# Patient Record
Sex: Female | Born: 1970 | Race: White | Hispanic: No | Marital: Married | Smoking: Never smoker
Health system: Southern US, Community
[De-identification: ages and names within clinical notes are randomized; demographics above are authoritative.]

## PROBLEM LIST (undated history)

## (undated) DIAGNOSIS — E039 Hypothyroidism, unspecified: Secondary | ICD-10-CM

## (undated) DIAGNOSIS — F419 Anxiety disorder, unspecified: Secondary | ICD-10-CM

## (undated) DIAGNOSIS — G4700 Insomnia, unspecified: Secondary | ICD-10-CM

---

## 1997-08-06 ENCOUNTER — Ambulatory Visit (HOSPITAL_COMMUNITY): Admission: RE | Admit: 1997-08-06 | Discharge: 1997-08-06 | Payer: Self-pay | Admitting: Obstetrics and Gynecology

## 1997-11-12 ENCOUNTER — Other Ambulatory Visit: Admission: RE | Admit: 1997-11-12 | Discharge: 1997-11-12 | Payer: Self-pay | Admitting: Obstetrics and Gynecology

## 1998-02-18 ENCOUNTER — Other Ambulatory Visit: Admission: RE | Admit: 1998-02-18 | Discharge: 1998-02-18 | Payer: Self-pay | Admitting: Obstetrics and Gynecology

## 1998-08-28 ENCOUNTER — Emergency Department (HOSPITAL_COMMUNITY): Admission: EM | Admit: 1998-08-28 | Discharge: 1998-08-28 | Payer: Self-pay

## 1998-09-23 ENCOUNTER — Other Ambulatory Visit: Admission: RE | Admit: 1998-09-23 | Discharge: 1998-09-23 | Payer: Self-pay | Admitting: Obstetrics and Gynecology

## 1999-01-06 ENCOUNTER — Other Ambulatory Visit: Admission: RE | Admit: 1999-01-06 | Discharge: 1999-01-06 | Payer: Self-pay | Admitting: Obstetrics and Gynecology

## 2000-04-09 ENCOUNTER — Other Ambulatory Visit: Admission: RE | Admit: 2000-04-09 | Discharge: 2000-04-09 | Payer: Self-pay | Admitting: Obstetrics and Gynecology

## 2000-11-10 ENCOUNTER — Encounter (INDEPENDENT_AMBULATORY_CARE_PROVIDER_SITE_OTHER): Payer: Self-pay

## 2000-11-10 ENCOUNTER — Ambulatory Visit (HOSPITAL_COMMUNITY): Admission: RE | Admit: 2000-11-10 | Discharge: 2000-11-10 | Payer: Self-pay | Admitting: Obstetrics and Gynecology

## 2001-04-29 ENCOUNTER — Encounter: Payer: Self-pay | Admitting: Neurosurgery

## 2001-05-03 ENCOUNTER — Encounter: Payer: Self-pay | Admitting: Neurosurgery

## 2001-05-03 ENCOUNTER — Inpatient Hospital Stay (HOSPITAL_COMMUNITY): Admission: RE | Admit: 2001-05-03 | Discharge: 2001-05-04 | Payer: Self-pay | Admitting: Neurosurgery

## 2002-01-11 ENCOUNTER — Other Ambulatory Visit: Admission: RE | Admit: 2002-01-11 | Discharge: 2002-01-11 | Payer: Self-pay | Admitting: Obstetrics and Gynecology

## 2002-09-07 ENCOUNTER — Other Ambulatory Visit: Admission: RE | Admit: 2002-09-07 | Discharge: 2002-09-07 | Payer: Self-pay | Admitting: Obstetrics and Gynecology

## 2003-01-29 ENCOUNTER — Inpatient Hospital Stay (HOSPITAL_COMMUNITY): Admission: AD | Admit: 2003-01-29 | Discharge: 2003-01-29 | Payer: Self-pay | Admitting: Obstetrics and Gynecology

## 2003-03-10 ENCOUNTER — Inpatient Hospital Stay (HOSPITAL_COMMUNITY): Admission: AD | Admit: 2003-03-10 | Discharge: 2003-03-10 | Payer: Self-pay | Admitting: Obstetrics and Gynecology

## 2003-03-19 ENCOUNTER — Inpatient Hospital Stay (HOSPITAL_COMMUNITY): Admission: AD | Admit: 2003-03-19 | Discharge: 2003-03-21 | Payer: Self-pay | Admitting: Obstetrics and Gynecology

## 2003-05-02 ENCOUNTER — Other Ambulatory Visit: Admission: RE | Admit: 2003-05-02 | Discharge: 2003-05-02 | Payer: Self-pay | Admitting: Obstetrics and Gynecology

## 2004-03-08 ENCOUNTER — Emergency Department (HOSPITAL_COMMUNITY): Admission: EM | Admit: 2004-03-08 | Discharge: 2004-03-08 | Payer: Self-pay | Admitting: Emergency Medicine

## 2005-02-27 ENCOUNTER — Encounter: Admission: RE | Admit: 2005-02-27 | Discharge: 2005-02-27 | Payer: Self-pay | Admitting: Obstetrics and Gynecology

## 2005-05-05 ENCOUNTER — Ambulatory Visit: Payer: Self-pay | Admitting: Family Medicine

## 2005-07-29 ENCOUNTER — Ambulatory Visit: Payer: Self-pay | Admitting: Family Medicine

## 2005-08-10 ENCOUNTER — Emergency Department (HOSPITAL_COMMUNITY): Admission: EM | Admit: 2005-08-10 | Discharge: 2005-08-10 | Payer: Self-pay | Admitting: Emergency Medicine

## 2005-08-11 ENCOUNTER — Ambulatory Visit (HOSPITAL_COMMUNITY): Admission: RE | Admit: 2005-08-11 | Discharge: 2005-08-11 | Payer: Self-pay | Admitting: Emergency Medicine

## 2005-10-19 ENCOUNTER — Ambulatory Visit: Payer: Self-pay | Admitting: Family Medicine

## 2006-07-20 ENCOUNTER — Encounter (INDEPENDENT_AMBULATORY_CARE_PROVIDER_SITE_OTHER): Payer: Self-pay | Admitting: *Deleted

## 2006-07-20 ENCOUNTER — Ambulatory Visit (HOSPITAL_BASED_OUTPATIENT_CLINIC_OR_DEPARTMENT_OTHER): Admission: RE | Admit: 2006-07-20 | Discharge: 2006-07-20 | Payer: Self-pay | Admitting: Orthopaedic Surgery

## 2010-02-14 ENCOUNTER — Ambulatory Visit (HOSPITAL_COMMUNITY)
Admission: RE | Admit: 2010-02-14 | Discharge: 2010-02-14 | Payer: 59 | Attending: Obstetrics and Gynecology | Admitting: Obstetrics and Gynecology

## 2010-08-08 NOTE — H&P (Signed)
Kathryn Marshall, Kathryn Marshall                       ACCOUNT NO.:  [id number]   MEDICAL RECORD NO.:  [id number]                   PATIENT TYPE:   LOCATION:                                       FACILITY:   PHYSICIAN:  Lenoard Aden, M.D.             DATE OF BIRTH:  January 30, 1971   DATE OF ADMISSION:  03/19/2003  DATE OF DISCHARGE:                                HISTORY & PHYSICAL   CHIEF COMPLAINT:  Oligohydramnios.   HISTORY OF PRESENT ILLNESS:  The patient is a 40 year old white female G2 P1  EDD of April 01, 2003 who presents at 38 weeks with oligohydramnios.   PAST MEDICAL HISTORY:  1. Vaginal delivery in 1999.  2. LEEP in 1999.  3. Excision of vulvar scar tissue in 1999.  4. Foot surgery,  5. Wrist fracture.  6. Labioplasty in 2002.   FAMILY HISTORY:  Heart disease and breast cancer.   SOCIAL HISTORY:  The patient is smoker.  She denies domestic or physical  violence.   PRENATAL LABORATORY DATA:  Reveals GBS to be negative and blood type to be O  positive, Rh antibody negative.  Rubella immune.  Hepatitis, toxoplasmosis,  and HIV are negative.   PHYSICAL EXAMINATION:  GENERAL:  She is a well-developed, well-nourished  white female in no acute distress.  HEENT:  Normal.  LUNGS:  Clear.  HEART:  Regular rhythm.  ABDOMEN:  Soft, gravid, and nontender.  Estimated fetal weight of 7.5  pounds.  PELVIC:  Cervix is 1-2 cm, scarred, vertex, and -1.  EXTREMITIES:  Reveal no cords.  NEUROLOGIC:  Nonfocal.   IMPRESSION:  1. Thirty-eight-week obstetrical patient.  2. Oligohydramnios at term.   PLAN:  Proceed with induction.  Risks, benefits discussed.  Start high-dose  Pitocin.                                              Lenoard Aden, M.D.   RJT/MEDQ  D:  03/19/2003  T:  03/19/2003  Job:  [id number]

## 2010-08-08 NOTE — Op Note (Signed)
NAMEKAILEIGH, VISWANATHAN             ACCOUNT NO.:  [id number]   MEDICAL RECORD NO.:  [id number]          PATIENT TYPE:  AMB   LOCATION:  DSC                          FACILITY:  [facility]   PHYSICIAN:  Lubertha Basque. Dalldorf, M.D.DATE OF BIRTH:  [birth year]   DATE OF PROCEDURE:  07/20/2006  DATE OF DISCHARGE:                               OPERATIVE REPORT   PREOPERATIVE DIAGNOSIS:  Left foot second interspace Morton's neuroma.   POSTOPERATIVE DIAGNOSIS:  Left foot second interspace Morton's neuroma.   PROCEDURE:  Excision Morton's neuroma second interspace left foot.   ANESTHESIA:  General.   ATTENDING SURGEON:  Lubertha Basque. Jerl Santos, M.D.   ASSISTANT:  Lindwood Qua, P.A.   INDICATIONS FOR PROCEDURE:  The patient is a 40 year old woman with a  long history of a painful left foot.  She has persisted with difficulty  despite wide shoes and interspace injection.  She has undergone an MRI  scan which shows a prominent Morton's neuroma in the second interspace  of her left foot.  She has pain which limits her ability to wear certain  shoes and she would like to be able to wake board this summer in a tight  fitting device.  She is offered removal of her neuroma.  Informed  operative consent was obtained after discussion of possible  complications of reaction to anesthesia, infection, recurrence.  The  patient also understands that she will be numb in the affected  interspace forever.   SUMMARY OF FINDINGS AND PROCEDURE:  Under general anesthesia through a  small dorsal incision a fairly large Morton's neuroma was removed from  the second interspace of the left foot.  This was sent to pathology.   DESCRIPTION OF PROCEDURE:  The patient was taken to the operative suite  where general anesthetic was applied without difficulty.  She was  positioned supine and prepped, draped in normal sterile fashion.  After  the administration of IV Kefzol the left leg was elevated,  exsanguinated, tourniquet  inflated about the calf.  A dorsal incision  was made with dissection down to the metatarsal heads.  A lamina  spreader was placed.  I then incised the ligament between the metatarsal  heads.  A prominent neuroma was easily visualized.  This was resected  from just proximal to the metatarsal head to just past the bifurcation.  This was sent in formalin to pathology.  The tourniquet was deflated and  meticulous hemostasis was achieved with pressure and cautery.  The wound  was irrigated followed by reapproximation of the skin with nylon.  Some  Marcaine was injected about the incision site.  Adaptic was applied  followed by dry gauze and loose Ace wrap.  Estimated blood loss was  minimal.  Intraoperative fluids can be obtained from anesthesia records.  Tourniquet time can also be obtained from anesthesia records.   DISPOSITION:  The patient was extubated in the operating room and taken  to recovery in stable addition.  She was to go home same-day, follow up  in the office less than a week.  I will contact her by phone tonight.  Lubertha Basque Jerl Santos, M.D.  Electronically Signed     PGD/MEDQ  D:  07/20/2006  T:  07/20/2006  Job:  [id number]

## 2010-08-08 NOTE — H&P (Signed)
[facility]  Patient:    Kathryn Marshall, KINSEL Visit Number: [id number] MRN: [id number]          Service Type: Attending:  Lenoard Aden, M.D. Adm. Date:  11/10/00   CC:         [facility]   History and Physical  HISTORY OF PRESENT ILLNESS:   The patient is a 40 year old white female gravida 1, para 1, with a history of uncomplicated vaginal delivery in 1999, history of LEEP in 1999, who presents with vulvodynia and enlarging perianal lesion.  PAST MEDICAL HISTORY:         Remarkable for vaginal delivery in 1999, LEEP in 1999, excision of vulvar scar tissue in 1999, foot surgery, and wrist fracture.  FAMILY HISTORY:               Heart disease and breast cancer.  SOCIAL HISTORY:               Nonsmoker and nondrinker.  Denies domestic or physical violence.  PHYSICAL EXAMINATION:  GENERAL:                      She is a well-developed and well-nourished white female in no apparent distress.  HEENT:                        Normal.  LUNGS:                        Clear.  HEART:                        Regular rhythm.  ABDOMEN:                      Soft, scaphoid, and nontender.  PELVIC EXAMINATION:           Reveals evidence of scar tissue on the left labia minora with separation of the previous anatomical landmarks in the labial area, and an enlarging right perianal lesion which is tender to palpation.  RECTAL EXAMINATION:           Deferred.  EXTREMITIES:                  There are no cords.  NEUROLOGIC EXAMINATION:       Nonfocal.  IMPRESSION:                   1. Vulvodynia with vulvar scar tissue.                               2. Enlarging perianal lesion.  PLAN:                         Proceed with labioplasty, excision of vulvar scar tissue, and removal of perianal lesion.  Risks of anesthesia, infection, bleeding, and inability to cure vulvar pain discussed.  The patient acknowledges and desires to proceed. Attending:   Lenoard Aden, M.D. DD:  11/10/00 TD:  11/10/00 Job: [id number]

## 2010-08-08 NOTE — H&P (Signed)
[place]  Patient:    Kathryn Marshall, Kathryn Marshall Visit Number: [id number] MRN: [id number]          Service Type: SUR Location: 3000 [id number] Attending Physician:  Emeterio Reeve Dictated by:   Payton Doughty, M.D. Admit Date:  05/03/2001 Discharge Date: 05/04/2001                           History and Physical  ADMITTING DIAGNOSIS:  Herniated disk, C4-5 and C5-6.  SERVICE:  Neurosurgery.  HISTORY OF PRESENT ILLNESS:  This is a very nice 40 year old right-handed white girl who had had neck pain intermittently.  A couple of weeks ago had neck pain that was radiating down her arm to where she could hardly move it. Visited a [place], did not help her much.  Saw Dr. Jerl Santos who obtained an MRI of her cervical spine that demonstrates spondylitic change and biforaminal narrowing, worse on the right than on the left at 4-5, and a fresh herniated disk centrally at 4-5 and she was referred to me.  MEDICAL HISTORY:  Remarkable for no significant diseases.  She has had cosmetic surgery, she had a foot operation, and cervical cancer in situ.  ALLERGIES:  None.  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  FAMILY HISTORY:  Mom is 6 in okay health.  Daddys history is not given.  REVIEW OF SYSTEMS:  Remarkable for arm weakness, back pain, arm pain, and neck pain.  PHYSICAL EXAMINATION:  HEENT:  Within normal limits.  NECK:  She has reasonable range of motion of her neck.  She carries it slightly inner flexed and is limited in turning toward the right and left by discomfort.  CHEST:  Clear.  CARDIAC:  Regular rate and rhythm.  ABDOMEN:  Nontender, no hepatosplenomegaly.  EXTREMITIES:  Without clubbing or cyanosis.  GENITOURINARY:  Deferred.  PERIPHERAL PULSES:  Good.  NEUROLOGIC:  She is awake, alert, oriented.  Her cranial nerves  are intact. Motor exam shows 5/5 strength throughout the left upper extremity.  On the right side she has 5-/5 weakness of the deltoid, 5-/5 in the biceps, and triceps appears to be intact.  Sensory deficit is described in a right C6 distribution.  Reflexes are 1 at the biceps, 2 at the left biceps, 1 at the triceps bilaterally, 2 at the brachioradialis bilaterally.  Lower extremities are nonmyelopathic.  LABORATORY DATA:  She comes accompanied with plain films from her chiropractors office that demonstrates spondylitic change and posterior protruding osteophytes at C4-5, as well as an MRI that shows a central disk at 5-6 and biforaminal narrowing secondary to the osteophytic injuries at 4-5, right worse than left.  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  She has a small central canal and because of the osteophytes there is cord compression.  PLAN:  Anterior cervical diskectomy and fusion of both C4-5 and C5-C6.  The risks and benefits of this approach have been discussed with her and she wishes to proceed. Dictated by:   Payton Doughty, M.D. Attending Physician:  Emeterio Reeve DD:  05/03/01 TD:  05/03/01 Job: [id number]

## 2010-08-08 NOTE — Op Note (Signed)
[facility]  Patient:    Kathryn Marshall, Kathryn Marshall Visit Number: [id number] MRN: [id number]          Service Type: DSU Location: [facility] Attending Physician:  Lenoard Aden Proc. Date: 11/10/00 Adm. Date:  11/10/2000   CC:         [facility]   Operative Report  PREOPERATIVE DIAGNOSES:       1. Vulvodynia with vulvar scar tissue.                               2. Enlarging perianal lesion.  POSTOPERATIVE DIAGNOSES:      1. Vulvodynia with vulvar scar tissue.                               2. Enlarging perianal lesion.  OPERATION:                    1. Labioplasty with excision of vulvar scar                                  tissue.                               2. Excision of perianal lesion.  SURGEON:                      Lenoard Aden, M.D.  ANESTHESIA:                   General.  ESTIMATED BLOOD LOSS:         Less than 50 cc.  COMPLICATIONS:                None.  COUNTS:                       Correct.  DISPOSITION:                  The patient went to recovery in good condition.  SPECIMEN:                     Perianal lesion.  DESCRIPTION OF PROCEDURE:     After being apprised of the risks of anesthesia, infection, bleeding, injury to abdominal organs and need for repair, inability to cure vulvar pain, the patient was brought to the operating room where she was administered general anesthesia without complications, prepped and draped in the usual sterile fashion.  At this time, the left labia minora is grasped at the two areas consistent with scar tissue.  The scar tissue is excised using a #15 blade scalpel and sharp dissection with fine iris type scissors. At this time, the labia minora is reestablished using a 4-0 Monocryl in interrupted fashion.  After excision of the scar tissue, the suture defect is reapproximated using fine interrupted sutures with hemostasis achieved.  The perianal lesion is then infiltrated using a dilute  Marcaine and epinephrine solution and excised using iris scissors.  The defect is closed using a 4-0 Monocryl suture.  Good hemostasis is noted.  Of note is that electrocautery is used to achieve hemostasis at the base of the labioplasty defect.  At this time, bladder is catheterized until  empty.  Clear urine is noted.  The patient tolerated the procedure well, was awakened, and transferred to the recovery room in good condition. Attending Physician:  Lenoard Aden DD:  11/10/00 TD:  11/11/00 Job: [id number]

## 2010-08-08 NOTE — Op Note (Signed)
[place]  Patient:    Kathryn Marshall, Kathryn Marshall Visit Number: [id number] MRN: [id number]          Service Type: SUR Location: 3000 [id number] Attending Physician:  Emeterio Reeve Dictated by:   Payton Doughty, M.D. Proc. Date: 05/03/01 Admit Date:  05/03/2001                             Operative Report  PREOPERATIVE DIAGNOSES: 1. Herniated disk at C5-6. 2. 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  POSTOPERATIVE DIAGNOSES: 1. Herniated disk at C5-6. 2. 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  PROCEDURE:  C4-5, C5-6 anterior cervical diskectomy and fusion with Tether plate.  SURGEON:  Payton Doughty, M.D.  NURSE ASSISTANT:  [facility].  DOCTOR ASSISTANT:  Tanya Nones. Jeral Fruit, M.D.  ANESTHESIA:  General endotracheal.  PREPARATION:  Sterile Betadine prep and scrub with alcohol wipe.  COMPLICATIONS:  None.  DESCRIPTION OF PROCEDURE:  This is a 40 year old right-handed white girl with herniated disk at C5-6 and severe spondylosis at 4-5.  She was taken to the operating room and smoothly anesthetized and intubated, placed supine on the operating table.  Following shave, prep, and drape in the usual sterile fashion, the skin was incised in the midline at the medial border of the sternocleidomastoid muscle at the medial border of the carotid tubercle.  The platysma was identified, elevated, divided, and undermined.  The sternocleidomastoid was identified, and medial dissection revealed the carotid artery retracted laterally to the left, the trachea and esophagus retracted laterally to the right, exposing the bones of the anterior cervical spine.  A marker was placed, intraoperative x-ray obtained to confirm correctness of level.  Having confirmed correctness of the level, the longus colli were taken down bilaterally and the Shadow Line retractor placed.  Diskectomy was then carried out at C4-5 and C5-6 under gross observation.  The operating microscope was then brought in and microdissection  technique used to complete the diskectomy, dissecting the anterior epidural space and decompressing the nerve roots bilaterally.  Having completed this decompression, 7 mm bone grafts were fashioned from patellar allograft and tapped into place.  A 32 mm two-level Tether plate was then placed using 13 mm screws, two in C4, one in C5, and two in C6.  Intraoperative x-ray showed good placement of bone grafts, plate, and screws.  The wound was irrigated and hemostasis assured.  The platysma was reapproximated with 3-0 Vicryl in interrupted fashion, the subcutaneous tissue was reapproximated with 3-0 Vicryl in interrupted fashion, and the skin was closed with 4-0 Vicryl in running subcuticular fashion. Benzoin and Steri-Strips were placed and made occlusive with Telfa and OpSite. The patient then returned to the recovery room in good condition. Dictated by: Payton Doughty, M.D. Attending Physician:  Emeterio Reeve DD:  05/03/01 TD:  05/04/01 Job: [id number]

## 2014-09-04 ENCOUNTER — Emergency Department
Admission: EM | Admit: 2014-09-04 | Discharge: 2014-09-04 | Discharge status: Absent without leave | Payer: Self-pay | Source: Home / Self Care | Attending: Emergency Medicine | Admitting: Emergency Medicine

## 2014-11-30 ENCOUNTER — Other Ambulatory Visit (HOSPITAL_BASED_OUTPATIENT_CLINIC_OR_DEPARTMENT_OTHER): Payer: Self-pay | Admitting: Family Medicine

## 2014-11-30 DIAGNOSIS — E049 Nontoxic goiter, unspecified: Secondary | ICD-10-CM

## 2014-12-04 ENCOUNTER — Ambulatory Visit (HOSPITAL_BASED_OUTPATIENT_CLINIC_OR_DEPARTMENT_OTHER)
Admission: RE | Admit: 2014-12-04 | Discharge: 2014-12-04 | Disposition: A | Discharge status: Home / Self Care | Payer: 59 | Source: Ambulatory Visit | Attending: Family Medicine | Admitting: Family Medicine

## 2014-12-04 DIAGNOSIS — E010 Iodine-deficiency related diffuse (endemic) goiter: Secondary | ICD-10-CM | POA: Insufficient documentation

## 2014-12-04 DIAGNOSIS — E049 Nontoxic goiter, unspecified: Secondary | ICD-10-CM

## 2014-12-04 DIAGNOSIS — E042 Nontoxic multinodular goiter: Secondary | ICD-10-CM | POA: Insufficient documentation

## 2015-12-23 ENCOUNTER — Ambulatory Visit (INDEPENDENT_AMBULATORY_CARE_PROVIDER_SITE_OTHER): Payer: BLUE CROSS/BLUE SHIELD | Admitting: Physician Assistant

## 2015-12-23 DIAGNOSIS — M7701 Medial epicondylitis, right elbow: Secondary | ICD-10-CM | POA: Diagnosis not present

## 2016-04-11 ENCOUNTER — Encounter (HOSPITAL_COMMUNITY): Payer: Self-pay | Admitting: Emergency Medicine

## 2016-04-11 ENCOUNTER — Emergency Department (HOSPITAL_COMMUNITY)
Admission: EM | Admit: 2016-04-11 | Discharge: 2016-04-12 | Disposition: A | Discharge status: Other Acute Inpatient Hospital | Payer: BLUE CROSS/BLUE SHIELD | Attending: Emergency Medicine | Admitting: Emergency Medicine

## 2016-04-11 DIAGNOSIS — Y638 Failure in dosage during other surgical and medical care: Secondary | ICD-10-CM | POA: Diagnosis not present

## 2016-04-11 DIAGNOSIS — F10120 Alcohol abuse with intoxication, uncomplicated: Secondary | ICD-10-CM | POA: Insufficient documentation

## 2016-04-11 DIAGNOSIS — E039 Hypothyroidism, unspecified: Secondary | ICD-10-CM | POA: Diagnosis not present

## 2016-04-11 DIAGNOSIS — Z79899 Other long term (current) drug therapy: Secondary | ICD-10-CM | POA: Diagnosis not present

## 2016-04-11 DIAGNOSIS — F1020 Alcohol dependence, uncomplicated: Secondary | ICD-10-CM | POA: Diagnosis present

## 2016-04-11 DIAGNOSIS — T424X2A Poisoning by benzodiazepines, intentional self-harm, initial encounter: Secondary | ICD-10-CM | POA: Insufficient documentation

## 2016-04-11 DIAGNOSIS — F411 Generalized anxiety disorder: Secondary | ICD-10-CM | POA: Diagnosis present

## 2016-04-11 DIAGNOSIS — T424X1A Poisoning by benzodiazepines, accidental (unintentional), initial encounter: Secondary | ICD-10-CM

## 2016-04-11 DIAGNOSIS — F10920 Alcohol use, unspecified with intoxication, uncomplicated: Secondary | ICD-10-CM

## 2016-04-11 HISTORY — DX: Hypothyroidism, unspecified: E03.9

## 2016-04-11 HISTORY — DX: Insomnia, unspecified: G47.00

## 2016-04-11 HISTORY — DX: Anxiety disorder, unspecified: F41.9

## 2016-04-11 LAB — I-STAT BETA HCG BLOOD, ED (MC, WL, AP ONLY)

## 2016-04-11 LAB — CBC
HEMATOCRIT: 40.7 % (ref 36.0–46.0)
HEMOGLOBIN: 13.6 g/dL (ref 12.0–15.0)
MCH: 28.3 pg (ref 26.0–34.0)
MCHC: 33.4 g/dL (ref 30.0–36.0)
MCV: 84.6 fL (ref 78.0–100.0)
Platelets: 259 10*3/uL (ref 150–400)
RBC: 4.81 MIL/uL (ref 3.87–5.11)
RDW: 13.2 % (ref 11.5–15.5)
WBC: 10.6 10*3/uL — ABNORMAL HIGH (ref 4.0–10.5)

## 2016-04-11 LAB — COMPREHENSIVE METABOLIC PANEL
ALT: 17 U/L (ref 14–54)
AST: 19 U/L (ref 15–41)
Albumin: 3.8 g/dL (ref 3.5–5.0)
Alkaline Phosphatase: 97 U/L (ref 38–126)
Anion gap: 9 (ref 5–15)
BUN: 10 mg/dL (ref 6–20)
CHLORIDE: 110 mmol/L (ref 101–111)
CO2: 21 mmol/L — ABNORMAL LOW (ref 22–32)
CREATININE: 0.59 mg/dL (ref 0.44–1.00)
Calcium: 9 mg/dL (ref 8.9–10.3)
GFR calc Af Amer: >60 mL/min (ref >60)
GFR calc non Af Amer: >60 mL/min (ref >60)
GLUCOSE: 104 mg/dL — AB (ref 65–99)
Potassium: 3.8 mmol/L (ref 3.5–5.1)
SODIUM: 140 mmol/L (ref 135–145)
Total Bilirubin: 0.2 mg/dL — ABNORMAL LOW (ref 0.3–1.2)
Total Protein: 6.8 g/dL (ref 6.5–8.1)

## 2016-04-11 LAB — RAPID URINE DRUG SCREEN, HOSP PERFORMED
AMPHETAMINES: NOT DETECTED
Barbiturates: NOT DETECTED
Benzodiazepines: POSITIVE — AB
Cocaine: NOT DETECTED
OPIATES: NOT DETECTED
TETRAHYDROCANNABINOL: NOT DETECTED

## 2016-04-11 LAB — ACETAMINOPHEN LEVEL: Acetaminophen (Tylenol), Serum: <10 ug/mL — ABNORMAL LOW (ref 10–30)

## 2016-04-11 LAB — ETHANOL: Alcohol, Ethyl (B): 216 mg/dL — ABNORMAL HIGH (ref <5)

## 2016-04-11 LAB — CBG MONITORING, ED: GLUCOSE-CAPILLARY: 94 mg/dL (ref 65–99)

## 2016-04-11 LAB — SALICYLATE LEVEL: Salicylate Lvl: <7 mg/dL (ref 2.8–30.0)

## 2016-04-11 MED ORDER — VITAMIN B-1 100 MG PO TABS
100.0000 mg | ORAL_TABLET | Freq: Every day | ORAL | Status: DC
  Administered 2016-04-11 – 2016-04-12 (×2): 100 mg via ORAL
  Filled 2016-04-11 (×2): qty 1

## 2016-04-11 MED ORDER — LOPERAMIDE HCL 2 MG PO CAPS: 2.0000 mg | ORAL_CAPSULE | ORAL | Status: DC | PRN

## 2016-04-11 MED ORDER — ONDANSETRON 4 MG PO TBDP: 4.0000 mg | ORAL_TABLET | Freq: Four times a day (QID) | ORAL | Status: DC | PRN

## 2016-04-11 MED ORDER — FLUOXETINE HCL 20 MG PO CAPS
40.0000 mg | ORAL_CAPSULE | Freq: Every day | ORAL | Status: DC
  Administered 2016-04-11 – 2016-04-12 (×2): 40 mg via ORAL
  Filled 2016-04-11 (×2): qty 2

## 2016-04-11 MED ORDER — LORAZEPAM 1 MG PO TABS
1.0000 mg | ORAL_TABLET | Freq: Three times a day (TID) | ORAL | Status: DC
  Administered 2016-04-12: 1 mg via ORAL
  Filled 2016-04-11: qty 1

## 2016-04-11 MED ORDER — ADULT MULTIVITAMIN W/MINERALS CH
1.0000 | ORAL_TABLET | Freq: Every day | ORAL | Status: DC
  Administered 2016-04-11 – 2016-04-12 (×2): 1 via ORAL
  Filled 2016-04-11 (×2): qty 1

## 2016-04-11 MED ORDER — LORAZEPAM 1 MG PO TABS: 1.0000 mg | ORAL_TABLET | Freq: Every day | ORAL | Status: DC

## 2016-04-11 MED ORDER — THIAMINE HCL 100 MG/ML IJ SOLN
100.0000 mg | Freq: Once | INTRAMUSCULAR | Status: DC
Start: 2016-04-11 — End: 2016-04-12

## 2016-04-11 MED ORDER — LORAZEPAM 1 MG PO TABS: 1.0000 mg | ORAL_TABLET | Freq: Two times a day (BID) | ORAL | Status: DC

## 2016-04-11 MED ORDER — HYDROXYZINE HCL 25 MG PO TABS: 25.0000 mg | ORAL_TABLET | Freq: Four times a day (QID) | ORAL | Status: DC | PRN

## 2016-04-11 MED ORDER — LORAZEPAM 1 MG PO TABS
1.0000 mg | ORAL_TABLET | Freq: Four times a day (QID) | ORAL | Status: DC
  Administered 2016-04-11 (×3): 1 mg via ORAL
  Filled 2016-04-11 (×3): qty 1

## 2016-04-11 MED ORDER — TRAZODONE HCL 100 MG PO TABS: 100.0000 mg | ORAL_TABLET | Freq: Every evening | ORAL | Status: DC | PRN

## 2016-04-11 MED ORDER — LORAZEPAM 1 MG PO TABS: 1.0000 mg | ORAL_TABLET | Freq: Four times a day (QID) | ORAL | Status: DC | PRN

## 2016-04-11 NOTE — ED Triage Notes (Signed)
Patient arrives by EMS with complaints of Xanax overdose in a suicide attempt. Xanax,total 80 mg 1 hour ago with one bottle of wine. Patient has a 2 inch vertical laceration to left wrist-area bandaged by fire and has not been viewed. SBP 100-IV fluid bolus 100 cc's-SBP 100-110 with total 150 cc's NS given. CBG 116 HR 88 RR14. Combative on scene.

## 2016-04-11 NOTE — ED Notes (Signed)
Hourly rounding reveals patient sleeping in room. No complaints, stable, in no acute distress. Q15 minute rounds and monitoring via Security Cameras to continue. 

## 2016-04-11 NOTE — ED Provider Notes (Addendum)
[facility]-EMERGENCY DEPT Provider Note   CSN: [id number] Arrival date & time: 04/11/16  0004    By signing my name below, I, Kathryn Marshall, attest that this documentation has been prepared under the direction and in the presence of Dione Booze, MD. Electronically Signed: Valentino Marshall, ED Scribe. 04/11/16. 1:24 AM.  History   Chief Complaint Chief Complaint  Patient presents with  . Ingestion   The history is provided by the patient. No language interpreter was used.  LEVEL 5 CAVEAT: HPI and ROS limited due to altered mental status.   HPI Comments: Kathryn Marshall is a 46 y.o. female with PMHx of anxiety, insomnia, hypothyroid brought in by ambulance who presents to the Emergency Department presenting with s/p overdose after ingestion of Xanax. Per nurse note, pt had an overdose of xanax with suicide intentions. Per pt, she notes she only consumed 2 Xanax. She also states drinking some red wine beforehand. Pt states she was throwing her sister a party beforehand. Pt is denying any complaints at this moment. Denies SI.   Past Medical History:  Diagnosis Date  . Anxiety   . Hypothyroid   . Hypothyroidism   . Insomnia     There are no active problems to display for this patient.   History reviewed. No pertinent surgical history.  OB History    No data available       Home Medications    Prior to Admission medications   Medication Sig Start Date End Date Taking? Authorizing Provider  acetaminophen (TYLENOL) 500 MG tablet Take 500-1,000 mg by mouth every 6 (six) hours as needed for moderate pain.   Yes Historical Provider, MD  ALPRAZolam Prudy Feeler) 0.5 MG tablet Take 0.5 mg by mouth 3 (three) times daily as needed for anxiety or sleep. 01/14/16  Yes Historical Provider, MD  FLUoxetine (PROZAC) 20 MG capsule Take 40 mg by mouth every other day.  01/29/16 01/28/17 Yes Historical Provider, MD  levothyroxine (SYNTHROID, LEVOTHROID) 125 MCG tablet Take 125 mcg by mouth daily. 03/24/16   Yes Historical Provider, MD    Family History History reviewed. No pertinent family history.  Social History Social History  Substance Use Topics  . Smoking status: Never Smoker  . Smokeless tobacco: Never Used  . Alcohol use Yes     Allergies   Bee venom   Review of Systems Review of Systems  Unable to perform ROS: Mental status change  Psychiatric/Behavioral: Negative for suicidal ideas.  All other systems reviewed and are negative.    Physical Exam Updated Vital Signs BP 92/62 (BP Location: Left Arm)   Pulse 78   Temp 97.6 F (36.4 C) (Oral)   Resp 22   SpO2 97%   Physical Exam  Constitutional: She is oriented to person, place, and time. She appears well-developed and well-nourished.  Somnolent but arousable. Maintaing adeuqate oxygen saturation and proteting her airway. Moderate odor of ethanol on breath.   HENT:  Head: Normocephalic and atraumatic.  Eyes: EOM are normal. Pupils are equal, round, and reactive to light.  Neck: Normal range of motion. Neck supple. No JVD present.  Cardiovascular: Normal rate, regular rhythm and normal heart sounds.   No murmur heard. Pulmonary/Chest: Effort normal and breath sounds normal. She has no wheezes. She has no rales. She exhibits no tenderness.  Abdominal: Soft. Bowel sounds are normal. She exhibits no distension and no mass. There is no tenderness.  Musculoskeletal: Normal range of motion. She exhibits no edema.  Lymphadenopathy:    She  has no cervical adenopathy.  Neurological: She is alert and oriented to person, place, and time. No cranial nerve deficit. She exhibits normal muscle tone. Coordination normal.  Clinically intoxicated.   Skin: Skin is warm and dry. No rash noted.  Psychiatric: She has a normal mood and affect. Her behavior is normal. Judgment and thought content normal.  Nursing note and vitals reviewed.    ED Treatments / Results   DIAGNOSTIC STUDIES: Oxygen Saturation is 96% on RA, normal by  my interpretation.    COORDINATION OF CARE: 1:23 AM Discussed treatment plan with pt at bedside which includes labs and EKG and pt agreed to plan.   Labs (all labs ordered are listed, but only abnormal results are displayed) Labs Reviewed  COMPREHENSIVE METABOLIC PANEL - Abnormal; Notable for the following:       Result Value   CO2 21 (*)    Glucose, Bld 104 (*)    Total Bilirubin 0.2 (*)    All other components within normal limits  ETHANOL - Abnormal; Notable for the following:    Alcohol, Ethyl (B) 216 (*)    All other components within normal limits  ACETAMINOPHEN LEVEL - Abnormal; Notable for the following:    Acetaminophen (Tylenol), Serum <10 (*)    All other components within normal limits  CBC - Abnormal; Notable for the following:    WBC 10.6 (*)    All other components within normal limits  RAPID URINE DRUG SCREEN, HOSP PERFORMED - Abnormal; Notable for the following:    Benzodiazepines POSITIVE (*)    All other components within normal limits  SALICYLATE LEVEL  CBG MONITORING, ED  I-STAT BETA HCG BLOOD, ED ([facility], [facility], [facility] ONLY)    Procedures Procedures (including critical care time)  Medications Ordered in ED Medications - No data to display   Initial Impression / Assessment and Plan / ED Course  I have reviewed the triage vital signs and the nursing notes.  Pertinent lab results that were available during my care of the patient were reviewed by me and considered in my medical decision making (see chart for details).  Overdose of alprazolam and alcohol. Triage note states that she took a large quantity of alprazolam at with suicidal intent. Patient tells me that she took 2 tablets with the intent of going to sleep. Degree of obtundation is not in line with the report of her having taken 80 mg. She will be observed in the ED. Currently, she is maintaining her airway and maintaining oxygen saturation and is hemodynamically stable.  6:22 AM On reexam, patient is  awake and coherent and, once again, denies suicidal intent. She thinks she may have taken 2-1/2 tablets at most. She is awake enough to be discharged. No evidence of actual suicide attempt.  Final Clinical Impressions(s) / ED Diagnoses   Final diagnoses:  Overdose of benzodiazepine, accidental or unintentional, initial encounter  Alcohol intoxication, uncomplicated (HCC)    New Prescriptions New Prescriptions   No medications on file    I personally performed the services described in this documentation, which was scribed in my presence. The recorded information has been reviewed and is accurate.       Dione Booze, MD 04/11/16 [id number]   Nursing has expressed concern that the patient was reported to have taken 80 mg of Xanax with suicidal intent. Although patient has denied this to me on 2 occasions, and clinical picture is more consistent with the doses that patient described to me, will get  TTS consultation to try to be sure that she is not suicidal.   Dione Boozeavid Leolia Vinzant, MD 04/11/16 [id number]

## 2016-04-11 NOTE — Discharge Instructions (Signed)
Do not take Xanax when you have been drinking. Only take it at the dose prescribed for you.
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
## 2016-04-11 NOTE — ED Notes (Signed)
Report to include Situation, Background, Assessment, and Recommendations received from RN. Patient alert and oriented, warm and dry, in no acute distress. Patient denies SI, HI, AVH and pain. Patient made aware of Q15 minute rounds and security cameras for their safety. Patient instructed to come to me with needs or concerns.  

## 2016-04-11 NOTE — ED Notes (Signed)
[facility] -OBS in ED on cardiac monitor for 6 hours post ingestion -DO NOT give Romazican since patient is on xanax-could cause seizure activity and withdrawal symptoms -Fluid boluses as needed for SBP <100 -No charcoal at this time

## 2016-04-11 NOTE — ED Notes (Signed)
TTS Provider at bedside. 

## 2016-04-11 NOTE — ED Notes (Signed)
Patient transferred from TCU earlier today.  Patient was oriented to the unit and to her surroundings.  IVC paperwork taken out prior to her transfer.  Patient minimizes events that brought her to the hospital, but states she did not take more than 2 mg of alprazolam.  She does admit to drinking a bottle of wine.  States her family is quite dysfunctional and she is often surrounded by drama.  Has a prescription for alprazolam from her family doctor.  Also states she would like to get off of it and have something else prescribed to help her sleep.

## 2016-04-11 NOTE — Progress Notes (Signed)
CSW faxed patient's IVC paperwork to magistrate office. Magistrate confirmed IVC paperwork received. CSW filed patient's IVC paperwork in IVC log book.  

## 2016-04-11 NOTE — ED Notes (Signed)
Hourly rounding reveals patient in room. No complaints, stable, in no acute distress. Q15 minute rounds and monitoring via Security Cameras to continue. 

## 2016-04-11 NOTE — ED Notes (Signed)
Bed: RESA Expected date:  Expected time:  Means of arrival:  Comments: EMS Overdose Xanax/ETOH

## 2016-04-11 NOTE — ED Notes (Signed)
Poison Control states patient is cleared from there stand point.
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
## 2016-04-12 ENCOUNTER — Inpatient Hospital Stay (HOSPITAL_COMMUNITY)
Admission: AD | Admit: 2016-04-12 | Discharge: 2016-04-15 | DRG: 881 | Disposition: A | Discharge status: Home / Self Care | Payer: BLUE CROSS/BLUE SHIELD | Source: Intra-hospital | Attending: Psychiatry | Admitting: Psychiatry

## 2016-04-12 ENCOUNTER — Encounter (HOSPITAL_COMMUNITY): Payer: Self-pay

## 2016-04-12 DIAGNOSIS — Z91030 Bee allergy status: Secondary | ICD-10-CM | POA: Diagnosis not present

## 2016-04-12 DIAGNOSIS — G4700 Insomnia, unspecified: Secondary | ICD-10-CM | POA: Diagnosis present

## 2016-04-12 DIAGNOSIS — F329 Major depressive disorder, single episode, unspecified: Secondary | ICD-10-CM | POA: Diagnosis present

## 2016-04-12 DIAGNOSIS — T1491XA Suicide attempt, initial encounter: Secondary | ICD-10-CM | POA: Diagnosis not present

## 2016-04-12 DIAGNOSIS — F411 Generalized anxiety disorder: Secondary | ICD-10-CM | POA: Diagnosis present

## 2016-04-12 DIAGNOSIS — E039 Hypothyroidism, unspecified: Secondary | ICD-10-CM | POA: Diagnosis present

## 2016-04-12 DIAGNOSIS — F1020 Alcohol dependence, uncomplicated: Secondary | ICD-10-CM | POA: Diagnosis present

## 2016-04-12 DIAGNOSIS — T424X2A Poisoning by benzodiazepines, intentional self-harm, initial encounter: Secondary | ICD-10-CM | POA: Diagnosis not present

## 2016-04-12 DIAGNOSIS — T50901A Poisoning by unspecified drugs, medicaments and biological substances, accidental (unintentional), initial encounter: Secondary | ICD-10-CM | POA: Diagnosis present

## 2016-04-12 MED ORDER — ALUM & MAG HYDROXIDE-SIMETH 200-200-20 MG/5ML PO SUSP: 30.0000 mL | ORAL | Status: DC | PRN

## 2016-04-12 MED ORDER — ADULT MULTIVITAMIN W/MINERALS CH
1.0000 | ORAL_TABLET | Freq: Every day | ORAL | Status: DC
  Administered 2016-04-13 – 2016-04-15 (×3): 1 via ORAL
  Filled 2016-04-12 (×6): qty 1

## 2016-04-12 MED ORDER — ACETAMINOPHEN 325 MG PO TABS
650.0000 mg | ORAL_TABLET | Freq: Four times a day (QID) | ORAL | Status: DC | PRN
Start: 2016-04-12 — End: 2016-04-15

## 2016-04-12 MED ORDER — TRAZODONE HCL 100 MG PO TABS: 100.0000 mg | ORAL_TABLET | Freq: Every evening | ORAL | Status: DC | PRN

## 2016-04-12 MED ORDER — LEVOTHYROXINE SODIUM 125 MCG PO TABS
125.0000 ug | ORAL_TABLET | Freq: Every day | ORAL | Status: DC
  Administered 2016-04-12 – 2016-04-15 (×4): 125 ug via ORAL
  Filled 2016-04-12 (×8): qty 1

## 2016-04-12 MED ORDER — LOPERAMIDE HCL 2 MG PO CAPS: 2.0000 mg | ORAL_CAPSULE | ORAL | Status: AC | PRN

## 2016-04-12 MED ORDER — FLUOXETINE HCL 20 MG PO CAPS
40.0000 mg | ORAL_CAPSULE | Freq: Every day | ORAL | Status: DC
  Administered 2016-04-13 – 2016-04-15 (×3): 40 mg via ORAL
  Filled 2016-04-12 (×7): qty 2

## 2016-04-12 MED ORDER — LORAZEPAM 1 MG PO TABS: 1.0000 mg | ORAL_TABLET | Freq: Every day | ORAL | Status: DC

## 2016-04-12 MED ORDER — VITAMIN B-1 100 MG PO TABS
100.0000 mg | ORAL_TABLET | Freq: Every day | ORAL | Status: DC
  Administered 2016-04-13 – 2016-04-15 (×3): 100 mg via ORAL
  Filled 2016-04-12 (×6): qty 1

## 2016-04-12 MED ORDER — HYDROXYZINE HCL 25 MG PO TABS: 25.0000 mg | ORAL_TABLET | Freq: Four times a day (QID) | ORAL | Status: AC | PRN

## 2016-04-12 MED ORDER — LORAZEPAM 1 MG PO TABS: 1.0000 mg | ORAL_TABLET | Freq: Two times a day (BID) | ORAL | Status: AC

## 2016-04-12 MED ORDER — ONDANSETRON 4 MG PO TBDP: 4.0000 mg | ORAL_TABLET | Freq: Four times a day (QID) | ORAL | Status: AC | PRN

## 2016-04-12 MED ORDER — MAGNESIUM HYDROXIDE 400 MG/5ML PO SUSP: 30.0000 mL | Freq: Every day | ORAL | Status: DC | PRN

## 2016-04-12 MED ORDER — LORAZEPAM 1 MG PO TABS: 1.0000 mg | ORAL_TABLET | Freq: Four times a day (QID) | ORAL | Status: AC

## 2016-04-12 MED ORDER — LORAZEPAM 1 MG PO TABS: 1.0000 mg | ORAL_TABLET | Freq: Four times a day (QID) | ORAL | Status: AC | PRN

## 2016-04-12 MED ORDER — LORAZEPAM 1 MG PO TABS
1.0000 mg | ORAL_TABLET | Freq: Three times a day (TID) | ORAL | Status: AC
  Filled 2016-04-12: qty 1

## 2016-04-12 NOTE — Progress Notes (Signed)
Patient attended AA group meeting.  

## 2016-04-12 NOTE — ED Notes (Signed)
Hourly rounding reveals patient sleeping in room. No complaints, stable, in no acute distress. Q15 minute rounds and monitoring via Security Cameras to continue. 
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
## 2016-04-12 NOTE — ED Notes (Signed)
Pt discharged safely with Sheriff deputy.  Pt was calm and cooperative.  All belongings were sent with pt. 

## 2016-04-12 NOTE — ED Notes (Signed)
Hourly rounding reveals patient in room. No complaints, stable, in no acute distress. Q15 minute rounds and monitoring via Security Cameras to continue. 

## 2016-04-13 DIAGNOSIS — F329 Major depressive disorder, single episode, unspecified: Principal | ICD-10-CM

## 2016-04-13 DIAGNOSIS — T424X2A Poisoning by benzodiazepines, intentional self-harm, initial encounter: Secondary | ICD-10-CM

## 2016-04-13 DIAGNOSIS — F411 Generalized anxiety disorder: Secondary | ICD-10-CM

## 2016-04-13 DIAGNOSIS — F1020 Alcohol dependence, uncomplicated: Secondary | ICD-10-CM

## 2016-04-13 DIAGNOSIS — T1491XA Suicide attempt, initial encounter: Secondary | ICD-10-CM

## 2016-04-13 DIAGNOSIS — T50901A Poisoning by unspecified drugs, medicaments and biological substances, accidental (unintentional), initial encounter: Secondary | ICD-10-CM | POA: Diagnosis present

## 2016-04-13 DIAGNOSIS — Z79899 Other long term (current) drug therapy: Secondary | ICD-10-CM

## 2016-04-13 NOTE — [facility] Group Notes (Signed)
[facility] LCSW Group Therapy  04/13/2016 3:51 PM  Type of Therapy:  Group Therapy  Participation Level:  Active  Participation Quality:  Attentive  Affect:  Appropriate  Cognitive:  Alert and Oriented  Insight:  Improving  Engagement in Therapy:  Improving  Modes of Intervention:  Confrontation, Discussion, Education, Problem-solving, Socialization and Support  Summary of Progress/Problems: Today's Topic: Overcoming Obstacles. Patients identified one short term goal and potential obstacles in reaching this goal. Patients processed barriers involved in overcoming these obstacles. Patients identified steps necessary for overcoming these obstacles and explored motivation (internal and external) for facing these difficulties head on.   Gearl Kimbrough N Smart LCSW 04/13/2016, 3:51 PM

## 2016-04-13 NOTE — Plan of Care (Signed)
Problem: Education: Goal: Utilization of techniques to improve thought processes will improve Outcome: Progressing Nurse discussed depression/coping skills with patient.    

## 2016-04-13 NOTE — [facility] Suicide Risk Assessment (Signed)
[facility] Admission Suicide Risk Assessment   Nursing information obtained from:   patient and chart  Demographic factors:   46 year old married female, employed, two children Current Mental Status:   see below Loss Factors:   takes care of elderly mother emotionally and financially  Historical Factors:   no prior psychiatric admissions, no history of prior suicidal behaviors or of self injurious behaviors  Risk Reduction Factors:   resilience, sense of responsibility to family   Total Time spent with patient: 45 minutes Principal Problem: overdose, undetermined intent  Diagnosis:   Patient Active Problem List   Diagnosis Date Noted  . MDD (major depressive disorder) [F32.9] 04/12/2016  . Generalized anxiety disorder [F41.1] 04/11/2016  . Alcohol use disorder, severe, dependence (HCC) [F10.20] 04/11/2016     Continued Clinical Symptoms:    The "Alcohol Use Disorders Identification Test", Guidelines for Use in Primary Care, Second Edition.  World Science writer [facility]). Score between 0-7:  no or low risk or alcohol related problems. Score between 8-15:  moderate risk of alcohol related problems. Score between 16-19:  high risk of alcohol related problems. Score 20 or above:  warrants further diagnostic evaluation for alcohol dependence and treatment.   CLINICAL FACTORS:  Patient is a 46 year old married female, employed. States her recollection of events is limited. She remembers drinking wine , partly because she wanted to " relax" in the context of her children arguing and fighting with each other. States she apparently " blacked out" as her memory of events is minimal. As per chart , patient presented via EMS following an overdose on Xanax- report states 80 mgs - patient states this cannot be accurate as she did not have that amount available to her. She states she thinks she took Xanax to sleep, relax and not due to suicidal intent. Patient presented with self inflicted lacerations to  wrist, which are noted - no sutures , no active bleeding. States she has no memory of self cutting behavior.  She denies prior psychiatric admissions or any history of suicidal /self injurious behaviors . She states she was started on Prozac and Xanax about 6 months ago, by PCP due to anxiety, inability to sleep due to husband's snoring. States she takes Xanax irregularly, not daily, and denies any pattern of abuse . Regarding alcohol , states she drinks wine, sometimes up to a bottle on weekends , denies pattern of dependence. She does endorse history of alcohol dependence in family- mother, sister   At this time not presenting with any symptoms of WDL  Dx- Status Post Overdose, undetermined intent Plan- continue Prozac, which patient states is helping and well tolerated. On Ativan detox taper to minimize risk of alcohol or BZD WDL .    Musculoskeletal: Strength & Muscle Tone: within normal limits- no tremors, no diaphoresis, no acute distress  Gait & Station: normal Patient leans: N/A  Psychiatric Specialty Exam: Physical Exam  ROS denies chest pain, no shortness of breath, no nausea, no vomiting , no fever, no chills   Blood pressure 125/73, pulse 72, temperature 98.4 F (36.9 C), temperature source Oral, resp. rate 20, height 5\' 6"  (1.676 m), weight 74.8 kg (165 lb).Body mass index is 26.63 kg/m.  General Appearance: Well Groomed  Eye Contact:  Good  Speech:  Normal Rate  Volume:  Normal  Mood:  minimizes depression, states mood is "OK" today   Affect:  appropriate, reactive , vaguely anxious   Thought Process:  Linear  Orientation:  Full (Time, Place, and Person)  Thought Content:  denies hallucinations, no delusions, not internally preoccupied   Suicidal Thoughts:  No currently denies any suicidal or self injurious ideations, denies any homicidal or violent ideations   Homicidal Thoughts:  No  Memory:  recent and remote grossly intact   Judgement:  Fair  Insight:  Fair   Psychomotor Activity:  Normal- no current symptoms of acute withdrawal- no tremors, no diaphoresis,no restlessness, vitals stable  Concentration:  Concentration: Good and Attention Span: Good  Recall:  Good  Fund of Knowledge:  Good  Language:  Good  Akathisia:  Negative  Handed:  Right  AIMS (if indicated):     Assets:  Desire for Improvement Resilience  ADL's:  Intact  Cognition:  WNL  Sleep:  Number of Hours: 6.25      COGNITIVE FEATURES THAT CONTRIBUTE TO RISK:  Closed-mindedness and Loss of executive function    SUICIDE RISK:   Moderate:  Frequent suicidal ideation with limited intensity, and duration, some specificity in terms of plans, no associated intent, good self-control, limited dysphoria/symptomatology, some risk factors present, and identifiable protective factors, including available and accessible social support.  PLAN OF CARE: Patient will be admitted to inpatient psychiatric unit for stabilization and safety. Will provide and encourage milieu participation. Provide medication management and maked adjustments as needed. Will also provide medication management to minimize risk of withdrawal.  Will follow daily.    I certify that inpatient services furnished can reasonably be expected to improve the patient's condition.   Nehemiah MassedOBOS, FERNANDO, MD 04/13/2016, 3:25 PM
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
## 2016-04-13 NOTE — [facility] Counselor (Signed)
Adult Comprehensive Assessment  Patient ID: Kathryn Marshall, female   DOB: 1970-09-10, 46 y.o.   MRN: [id number]  Information Source: Information source: Patient  Current Stressors:  Educational / Learning stressors: high school Employment / Job issues: part time work at 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 financial strain-cares for her mother and pays her bills Housing / Lack of housing: lives in home with her husband; 19yo daughter; 13yo son Physical health (include injuries & life threatening diseases): hyperthyroidism Social relationships: fair-good friend network Substance abuse: pt denies problem with xanax or alcohol however reports that she drank alot of wine and overtook her xanax Rx when frustrated with her kids Bereavement / Loss: none identified   Living/Environment/Situation:  Living Arrangements: Children, Spouse/significant other Living conditions (as described by patient or guardian): good living conditions.  How long has patient lived in current situation?: 15+ years  What is atmosphere in current home: Comfortable, Loving  Family History:  Marital status: Married Number of Years Married: 15 What types of issues is patient dealing with in the relationship?: close and healthy relationship per patient. "My husband brought me flowers."  Additional relationship information: n/a  Are you sexually active?: Yes What is your sexual orientation?: heterosexual Has your sexual activity been affected by drugs, alcohol, medication, or emotional stress?: n/a  Does patient have children?: Yes How many children?: 2 How is patient's relationship with their children?: 19yo daughter; 48 yo son. "they fight ALL THE TIME and it drives me crazy." Pt reports a strong relationship with her kids.    Childhood History:  By whom was/is the patient raised?: Both parents Additional childhood history information: mom is a Teacher, English as a foreign languagerecoving alcoholic--sober of 38 years;  Description of patient's relationship with caregiver when they were a child: close to both parents Patient's description of current relationship with people who raised him/her: father deceased: pt is now caretaker for her elderly mother who lives on her own. "I help her with bills, appts, and food. " How were you disciplined when you got in trouble as a child/adolescent?: n/a Does patient have siblings?: Yes Number of Siblings: 2 Description of patient's current relationship with siblings: pt has 2 sisters. "one is an alcoholic." Neither assist pt with caring for their mother and pt reports alot of resentment for this.  Did patient suffer any verbal/emotional/physical/sexual abuse as a child?: No Did patient suffer from severe childhood neglect?: No Has patient ever been sexually abused/assaulted/raped as an adolescent or adult?: No Was the patient ever a victim of a crime or a disaster?: No Witnessed domestic violence?: No Has patient been effected by domestic violence as an adult?: No  Education:  Highest grade of school patient has completed: 12th grade  Currently a student?: No Learning disability?: No  Employment/Work Situation:   Employment situation: Employed Where is patient currently employed?: [employer] How long has patient been employed?: several months Patient's job has been impacted by current illness: No What is the longest time patient has a held a job?: few years Where was the patient employed at that time?: n/a  Has patient ever been in the Eli Lilly and Companymilitary?: No Has patient ever served in combat?: No Did You Receive Any Psychiatric Treatment/Services While in Equities traderthe Military?: No Are There Guns or Other Weapons in Your Home?: No Are These Weapons Safely Secured?:  (n/a)  Financial Resources:  Financial  resources: Income from employment, Income from spouse, Private insurance Does patient have a representative payee or guardian?: No  Alcohol/Substance Abuse:   What has been your use of drugs/alcohol within the last 12 months?: denies. Pt reports that she typically has a few glasses of wine per week and is prescribed xanax but rarely takes. "This was the first and will be the last time I ever mix the two. I was so stressed with my kids fighting I just wanted to get some sleep.."  If attempted suicide, did drugs/alcohol play a role in this?: No (pt denies this was suicide attempt--superficical cuts to wrist when intoxicated.) Alcohol/Substance Abuse Treatment Hx: Denies past history If yes, describe treatment: n/a Has alcohol/substance abuse ever caused legal problems?: No  Social Support System:   Patient's Community Support System: Good Describe Community Support System: pt reports a good network of friends in the community Type of faith/religion: Ephriam Knuckles How does patient's faith help to cope with current illness?: n/a   Leisure/Recreation:   Leisure and Hobbies: spending time with family.   Strengths/Needs:   What things does the patient do well?: pt reports that she is usually a good wife, mother, and hard Financial controller. pt has insight into her anxiety issues In what areas does patient struggle / problems for patient: coping with anxiety; stress;   Discharge Plan:   Does patient have access to transportation?: Yes (husband) Will patient be returning to same living situation after discharge?: Yes (home with family) Currently receiving community mental health services: Yes (From Whom) (PCP [facility] --Dr. Cyndia Bent. pt wants to resume services with him at discharge. "He prescribes me xanax and prozac." Pt declines offer for psychiatrist or counselor.) If no, would patient like referral for services when discharged?: No (([place])) Does patient have financial barriers related to discharge  medications?: No  Summary/Recommendations:   Summary and Recommendations (to be completed by the evaluator): Patient is 46 yo female living in [place], [place] Pt presents to the hospital involuntarily after consuming bottle of wine and unknown amount of xanax. Patient reports that her children were arguing and she was feeling stressed. Patient denies any prior psychiatric hospitalizations and states that she typically drinks a few glasses of wine per week and is prescribed xanax for anxiety. Patient lives with her husband and children and workes part time. She denies SI/HI/AVH. Patient states that she does not have memory of cutting/scratching her wrists prior to admission. Patient would like to resume mental health services with her PCP Dr. Cyndia Bent at [facility] rather than a referral to psychiatrist/counselor. Recommendations for patient include: therapeutic milieu, encourage group attendance and participation, medication management for mood stabilization, and development of comprehensive mental wellness/sobriety plan.   Ledell Peoples Smart LCSW 04/13/2016 12:42 PM

## 2016-04-13 NOTE — Progress Notes (Addendum)
D:  Patient's self inventory sheet, patient sleeps good, no sleep medication.  Good appetite, normal energy level, good concentration.  Denied depression and hopeless, rated anxiety 1.  Denied withdrawals.  Denied SI.  Denied physical problems.  Denied pain.  Goal is discharge to family.  Plans to what helps meet her goal.  Will discuss discharge plan with family. A:  Patient refused ativan this morning, stated she does not need this medication.  Emotional support and encouragement given patient. R:  Patient denied SI and HI, contracts for safety.  Denied A/V hallucinations.  Safety maintained with 15 minute checks.  Patient has refused ativan throughout the day, stated she does not need this medication.

## 2016-04-13 NOTE — Tx Team (Signed)
Interdisciplinary Treatment and Diagnostic Plan Update  04/13/2016 Time of Session: 9:30AM Kathryn ChurchShannon Feagan MRN: [id number]  Principal Diagnosis: MDD (major depressive disorder)  Secondary Diagnoses: Principal Problem:   MDD (major depressive disorder)   Current Medications:  Current Facility-Administered Medications  Medication Dose Route Frequency Provider Last Rate Last Dose  . acetaminophen (TYLENOL) tablet 650 mg  650 mg Oral Q6H PRN Shuvon B Rankin, NP      . alum & mag hydroxide-simeth (MAALOX/MYLANTA) 200-200-20 MG/5ML suspension 30 mL  30 mL Oral Q4H PRN Shuvon B Rankin, NP      . FLUoxetine (PROZAC) capsule 40 mg  40 mg Oral Daily Shuvon B Rankin, NP   40 mg at 04/13/16 0744  . hydrOXYzine (ATARAX/VISTARIL) tablet 25 mg  25 mg Oral Q6H PRN Shuvon B Rankin, NP      . levothyroxine (SYNTHROID, LEVOTHROID) tablet 125 mcg  125 mcg Oral QAC breakfast Laveda AbbeLaurie Britton Parks, NP   125 mcg at 04/13/16 [id number]  . loperamide (IMODIUM) capsule 2-4 mg  2-4 mg Oral PRN Shuvon B Rankin, NP      . LORazepam (ATIVAN) tablet 1 mg  1 mg Oral Q6H PRN Shuvon B Rankin, NP      . LORazepam (ATIVAN) tablet 1 mg  1 mg Oral TID Shuvon B Rankin, NP       Followed by  . [START ON 04/14/2016] LORazepam (ATIVAN) tablet 1 mg  1 mg Oral BID Shuvon B Rankin, NP       Followed by  . [START ON 04/15/2016] LORazepam (ATIVAN) tablet 1 mg  1 mg Oral Daily Shuvon B Rankin, NP      . magnesium hydroxide (MILK OF MAGNESIA) suspension 30 mL  30 mL Oral Daily PRN Shuvon B Rankin, NP      . multivitamin with minerals tablet 1 tablet  1 tablet Oral Daily Shuvon B Rankin, NP   1 tablet at 04/13/16 0744  . ondansetron (ZOFRAN-ODT) disintegrating tablet 4 mg  4 mg Oral Q6H PRN Shuvon B Rankin, NP      . thiamine (VITAMIN B-1) tablet 100 mg  100 mg Oral Daily Shuvon B Rankin, NP   100 mg at 04/13/16 0744  . traZODone (DESYREL) tablet 100 mg  100 mg Oral QHS PRN Shuvon B Rankin, NP       PTA Medications: Prescriptions Prior to  Admission  Medication Sig Dispense Refill Last Dose  . acetaminophen (TYLENOL) 500 MG tablet Take 500-1,000 mg by mouth every 6 (six) hours as needed for moderate pain.   unknown  . ALPRAZolam (XANAX) 0.5 MG tablet Take 0.5 mg by mouth 3 (three) times daily as needed for anxiety or sleep.  0 04/10/2016 at Unknown time  . FLUoxetine (PROZAC) 20 MG capsule Take 40 mg by mouth every other day.    Past Week at Unknown time  . levothyroxine (SYNTHROID, LEVOTHROID) 125 MCG tablet Take 125 mcg by mouth daily.   04/10/2016 at Unknown time    Patient Stressors:    Patient Strengths:    Treatment Modalities: Medication Management, Group therapy, Case management,  1 to 1 session with clinician, Psychoeducation, Recreational therapy.   Physician Treatment Plan for Primary Diagnosis: MDD (major depressive disorder) Long Term Goal(s):     Short Term Goals:    Medication Management: Evaluate patient's response, side effects, and tolerance of medication regimen.  Therapeutic Interventions: 1 to 1 sessions, Unit Group sessions and Medication administration.  Evaluation of Outcomes: Progressing  Physician Treatment Plan for  Secondary Diagnosis: Principal Problem:   MDD (major depressive disorder)  Long Term Goal(s):     Short Term Goals:       Medication Management: Evaluate patient's response, side effects, and tolerance of medication regimen.  Therapeutic Interventions: 1 to 1 sessions, Unit Group sessions and Medication administration.  Evaluation of Outcomes: Progressing   RN Treatment Plan for Primary Diagnosis: MDD (major depressive disorder) Long Term Goal(s): Knowledge of disease and therapeutic regimen to maintain health will improve  Short Term Goals: Ability to remain free from injury will improve, Ability to disclose and discuss suicidal ideas and Ability to identify and develop effective coping behaviors will improve  Medication Management: RN will administer medications as  ordered by provider, will assess and evaluate patient's response and provide education to patient for prescribed medication. RN will report any adverse and/or side effects to prescribing provider.  Therapeutic Interventions: 1 on 1 counseling sessions, Psychoeducation, Medication administration, Evaluate responses to treatment, Monitor vital signs and CBGs as ordered, Perform/monitor CIWA, COWS, AIMS and Fall Risk screenings as ordered, Perform wound care treatments as ordered.  Evaluation of Outcomes: Progressing   LCSW Treatment Plan for Primary Diagnosis: MDD (major depressive disorder) Long Term Goal(s): Safe transition to appropriate next level of care at discharge, Engage patient in therapeutic group addressing interpersonal concerns.  Short Term Goals: Engage patient in aftercare planning with referrals and resources, Facilitate patient progression through stages of change regarding substance use diagnoses and concerns and Identify triggers associated with mental health/substance abuse issues  Therapeutic Interventions: Assess for all discharge needs, 1 to 1 time with Social worker, Explore available resources and support systems, Assess for adequacy in community support network, Educate family and significant other(s) on suicide prevention, Complete Psychosocial Assessment, Interpersonal group therapy.  Evaluation of Outcomes: Progressing   Progress in Treatment: Attending groups: No. New to unit. Continuing to assess.  Participating in groups: No. Taking medication as prescribed: Yes. Toleration medication: Yes. Family/Significant other contact made: No, will contact:  family member if patient consents. Patient understands diagnosis: Yes. Discussing patient identified problems/goals with staff: Yes. Medical problems stabilized or resolved: Yes. Denies suicidal/homicidal ideation: Yes. Issues/concerns per patient self-inventory: No. Other: n/a  New problem(s) identified: No,  Describe:  n/a  New Short Term/Long Term Goal(s): detox; medication stabilization; development of comprehensive mental wellness/sobriety plan.   Discharge Plan or Barriers: CSW assessing for appropriate referrals. Pt lives in [place], [place] and has [employer]. No current mental health providers.   Reason for Continuation of Hospitalization: Anxiety Depression Medication stabilization Withdrawal symptoms  Estimated Length of Stay: 2-3 days   Attendees: Patient: 04/13/2016 12:27 PM  Physician: Dr. Jama Flavors MD 04/13/2016 12:27 PM  Nursing: Doy Mince RN 04/13/2016 12:27 PM  RN Care Manager: Onnie Boer CM 04/13/2016 12:27 PM  Social Worker: Herbert Seta Smart, LCSW; Cherly Anderson LCSW 04/13/2016 12:27 PM  Recreational Therapist: Juliann Pares 04/13/2016 12:27 PM  Other: Hillery Jacks NP; Armandina Stammer NP 04/13/2016 12:27 PM  Other:  04/13/2016 12:27 PM  Other: 04/13/2016 12:27 PM    Scribe for Treatment Team: Ledell Peoples Smart, LCSW 04/13/2016 12:27 PM

## 2016-04-13 NOTE — Progress Notes (Signed)
Recreation Therapy Notes  Date: 04/13/16 Time: 0930 Location: [address] Group Room  Group Topic: Stress Management  Goal Area(s) Addresses:  Patient will verbalize importance of using healthy stress management.  Patient will identify positive emotions associated with healthy stress management.   Behavioral Response: Engaged  Intervention: Guided Imagery  Activity :  Peaceful Place.  LRT introduced the stress management technique of guided imagery.  LRT read a script that allowed patients to take a mental journey to their favorite place.  Patients were to listen and follow along as the LRT read the script.  Education:  Stress Management, Discharge Planning.   Education Outcome: Acknowledges edcuation/In group clarification offered/Needs additional education  Clinical Observations/Feedback: Pt attended group.   Kathryn Marshall, LRT/CTRS         Kathryn Marshall A 04/13/2016 11:50 AM 

## 2016-04-13 NOTE — Progress Notes (Signed)
Pt in bed aasleep  No distress noted    Took over care for this patient at present time    Will continue Q 15 min checks and monitor for needs

## 2016-04-14 NOTE — Plan of Care (Signed)
Problem: Education: Goal: Knowledge of the prescribed therapeutic regimen will improve Outcome: Progressing Nurse discussed depression/anxiety/coping skills with patient.    

## 2016-04-14 NOTE — [facility] Group Notes (Deleted)
[facility] LCSW Group Therapy  04/14/2016 3:01 PM  Type of Therapy:  Group Therapy  Participation Level:  Active  Participation Quality:  Attentive  Affect:  Appropriate  Cognitive:  Alert and Oriented  Insight:  Improving  Engagement in Therapy:  Improving  Modes of Intervention:  Confrontation, Discussion, Education, Problem-solving, Socialization and Support  Summary of Progress/Problems: MHA Speaker came to talk about his personal journey with substance abuse and addiction. The pt processed ways by which to relate to the speaker. MHA speaker provided handouts and educational information pertaining to groups and services offered by the [facility].   Diahn Waidelich N Smart LCSW 04/14/2016, 3:01 PM

## 2016-04-14 NOTE — [facility] Group Notes (Signed)

## 2016-04-14 NOTE — Progress Notes (Signed)
  [facility] :  Will you be returning to the same living situation after discharge:  Yes,  home At discharge, do you have transportation home?: Yes,  husband coming after lunch on Wed 04/15/16 Do you have the ability to pay for your medications: Yes,  [employer] private insurance  Release of information consent forms completed and submitted to medical records by CSW.  Patient to Follow up at: Follow-up Information    [facility]Medication Management Follow up on 04/23/2016.   Why:  Appt on this date for hospital follow-up/medication management at 11:45AM with Dr. Cyndia BentBadger. Thank you.  Contact information: ATTN: Dr. Cyndia BentBadger [address] [place], [place] [address] Phone: [phone] Fax: [phone]        Patient declined additional referrals. Provided with Mental Health Association pamphlet and information prior to discharge.   Next level of care provider has access to [facility] Link:no  Safety Planning and Suicide Prevention discussed: Yes,  SPE completed with pt's husband.  Have you used any form of tobacco in the last 30 days? (Cigarettes, Smokeless Tobacco, Cigars, and/or Pipes): Yes  Has patient been referred to the Quitline?: Patient refused referral  Patient has been referred for addiction treatment: Patient refused referral.   Ledell PeoplesHeather N Smart LCSW 04/14/2016, 3:04 PM

## 2016-04-14 NOTE — Progress Notes (Signed)
[facility]  04/14/2016 4:53 PM Kathryn ChurchShannon Marshall  MRN:  [id number] Subjective:  "I'm doing fine."   Objective:  Kathryn HerterShannon admitted after she ingested Xanax.  Patient states that she took the pills so that she could sleep.  Non disruptive.  Compliant with meds.  Interacting well with others.   Principal Problem: Drug overdose Diagnosis:   Patient Active Problem List   Diagnosis Date Noted  . Drug overdose [T50.901A]   . MDD (major depressive disorder) [F32.9] 04/12/2016  . Generalized anxiety disorder [F41.1] 04/11/2016  . Alcohol use disorder, severe, dependence (HCC) [F10.20] 04/11/2016   Total Time spent with patient: 30 minutes  Past Psychiatric History: see HPI  Past Medical History:  Past Medical History:  Diagnosis Date  . Anxiety   . Hypothyroid   . Hypothyroidism   . Insomnia    History reviewed. No pertinent surgical history. Family History: History reviewed. No pertinent family history. Family Psychiatric  History:  See HPI Social History:  History  Alcohol Use  . Yes     History  Drug Use    Social History   Social History  . Marital status: Married    Spouse name: N/A  . Number of children: N/A  . Years of education: N/A   Social History Main Topics  . Smoking status: Never Smoker  . Smokeless tobacco: Never Used  . Alcohol use Yes  . Drug use: Yes  . Sexual activity: Not Asked   Other Topics Concern  . None   Social History Narrative  . None   Additional Social History:                         Sleep: Good  Appetite:  Good  Current Medications: Current Facility-Administered Medications  Medication Dose Route Frequency Provider Last Rate Last Dose  . acetaminophen (TYLENOL) tablet 650 mg  650 mg Oral Q6H PRN Shuvon B Rankin, NP      . alum & mag hydroxide-simeth (MAALOX/MYLANTA) 200-200-20 MG/5ML suspension 30 mL  30 mL Oral Q4H PRN Shuvon B Rankin, NP      . FLUoxetine (PROZAC) capsule 40 mg  40 mg Oral Daily Shuvon B  Rankin, NP   40 mg at 04/14/16 0746  . hydrOXYzine (ATARAX/VISTARIL) tablet 25 mg  25 mg Oral Q6H PRN Shuvon B Rankin, NP      . levothyroxine (SYNTHROID, LEVOTHROID) tablet 125 mcg  125 mcg Oral QAC breakfast Laveda AbbeLaurie Britton Parks, NP   125 mcg at 04/14/16 [id number]  . loperamide (IMODIUM) capsule 2-4 mg  2-4 mg Oral PRN Shuvon B Rankin, NP      . LORazepam (ATIVAN) tablet 1 mg  1 mg Oral Q6H PRN Shuvon B Rankin, NP      . LORazepam (ATIVAN) tablet 1 mg  1 mg Oral BID Shuvon B Rankin, NP       Followed by  . [START ON 04/15/2016] LORazepam (ATIVAN) tablet 1 mg  1 mg Oral Daily Shuvon B Rankin, NP      . magnesium hydroxide (MILK OF MAGNESIA) suspension 30 mL  30 mL Oral Daily PRN Shuvon B Rankin, NP      . multivitamin with minerals tablet 1 tablet  1 tablet Oral Daily Shuvon B Rankin, NP   1 tablet at 04/14/16 0747  . ondansetron (ZOFRAN-ODT) disintegrating tablet 4 mg  4 mg Oral Q6H PRN Shuvon B Rankin, NP      . thiamine (VITAMIN B-1) tablet  100 mg  100 mg Oral Daily Shuvon B Rankin, NP   100 mg at 04/14/16 0747  . traZODone (DESYREL) tablet 100 mg  100 mg Oral QHS PRN Shuvon B Rankin, NP        Lab Results: No results found for this or any previous visit (from the past 48 hour(s)).  Blood Alcohol level:  Lab Results  Component Value Date   ETH 216 (H) 04/11/2016    Metabolic Disorder Labs: No results found for: HGBA1C, MPG No results found for: PROLACTIN No results found for: CHOL, TRIG, HDL, CHOLHDL, VLDL, LDLCALC  Physical Findings: AIMS: Facial and Oral Movements Muscles of Facial Expression: None, normal Lips and Perioral Area: None, normal Jaw: None, normal Tongue: None, normal,Extremity Movements Upper (arms, wrists, hands, fingers): None, normal Lower (legs, knees, ankles, toes): None, normal, Trunk Movements Neck, shoulders, hips: None, normal, Overall Severity Severity of abnormal movements (highest score from questions above): None, normal Incapacitation due to abnormal  movements: None, normal Patient's awareness of abnormal movements (rate only patient's report): No Awareness, Dental Status Current problems with teeth and/or dentures?: No Does patient usually wear dentures?: No  CIWA:  CIWA-Ar Total: 1 COWS:  COWS Total Score: 1  Musculoskeletal: Strength & Muscle Tone: within normal limits Gait & Station: normal Patient leans: N/A  Psychiatric Specialty Exam: Physical Exam  Nursing note and vitals reviewed.   ROS  Blood pressure 120/66, pulse 72, temperature 97.7 F (36.5 C), temperature source Oral, resp. rate 18, height 5\' 6"  (1.676 m), weight 74.8 kg (165 lb).Body mass index is 26.63 kg/m.   General Appearance: Casual and Guarded  Eye Contact:  Fair  Speech:  Clear and Coherent  Volume:  Normal  Mood:  Anxious  Affect:  Congruent  Thought Process:  Coherent  Orientation:  Full (Time, Place, and Person)  Thought Content:  Hallucinations: None  Suicidal Thoughts:  No  Homicidal Thoughts:  No  Memory:  Immediate;   Fair Recent;   Fair Remote;   Fair  Judgement:  Fair  Insight:  Fair  Psychomotor Activity:  Normal  Concentration:  Concentration: Fair  Recall:  [employer] of Knowledge:  Fair  Language:  Good  Akathisia:  No  Handed:  Right  AIMS (if indicated):     Assets:  Communication Skills Desire for Improvement Resilience Social Support  ADL's:  Intact  Cognition:  WNL  Sleep:  Number of Hours: 6.25   Treatment Plan Summary: Review of chart, vital signs, medications, and notes.  1-Individual and group therapy  2-Medication management for depression and anxiety: Medications reviewed with the patient.  3-Coping skills for depression, anxiety  4-Continue crisis stabilization and management  5-Address health issues--monitoring vital signs, stable  6-Treatment plan in progress to prevent relapse of depression and anxiety  Lindwood Qua, NP [facility] 04/14/2016, 4:53 PM

## 2016-04-14 NOTE — Progress Notes (Signed)
  D: Pt at the time of assessment denies any form of depression, anxiety, pain, SI, HI or AVH; states, "I feel wonderful; im ready to go home.  Pt was flat and withdrawn to self even while in the dayroom. A: Medications offered as prescribed.  Support, encouragement, and safe environment provided. 15-minute safety checks continue. R: Pt was med compliant.  Pt attended group. Safety checks continue.

## 2016-04-14 NOTE — Progress Notes (Signed)
Recreation Therapy Notes  Animal-Assisted Activity (AAA) Program Checklist/Progress Notes Patient Eligibility Criteria Checklist & Daily Group note for Rec TxIntervention  Date: 01.23.2018 Time: 2:50pm Location: [address]    AAA/T Program Assumption of Risk Form signed by Patient/ or Parent Legal Guardian Yes  Patient is free of allergies or sever asthma Yes  Patient reports no fear of animals Yes  Patient reports no history of cruelty to animals Yes  Patient understands his/her participation is voluntary Yes  Patient washes hands before animal contact Yes  Patient washes hands after animal contact Yes  Behavioral Response: Appropriate   Education:Hand Washing, Appropriate Animal Interaction   Education Outcome: Acknowledges education.   Clinical Observations/Feedback: Patient attended session and interacted appropriately with therapy dog and peers.   Marykay Lexenise L Rohan Juenger, LRT/CTRS           Kruti Horacek L 04/14/2016 3:04 PM

## 2016-04-14 NOTE — Progress Notes (Signed)
The patient attended this evening's A.A.meeting and was appropriate.  

## 2016-04-14 NOTE — Progress Notes (Signed)
D:  Patient's self inventory sheet, patient sleeps good, no sleep medication given.  Good appetite, normal energy level.  Denied depression, hopeless, anxiety.  Withdrawals.  Denied SI.  Denied physical problems.  Denied pain.  Goal I.D.K. A:  Patient continues to refuse ativan, stated she does not need this medication.  Emotional support and encouragement given patient. R:  Denied SI and HI, contracts for safety.  Denied A/V hallucinations.  Safety maintained with 15 minute checks.

## 2016-04-14 NOTE — [facility] Group Notes (Signed)
[facility] LCSW Group Therapy  04/14/2016 3:03 PM  Type of Therapy:  Group Therapy  Participation Level:  Active  Participation Quality:  Attentive  Affect:  Appropriate  Cognitive:  Alert and Oriented  Insight:  Improving  Engagement in Therapy:  Engaged  Modes of Intervention:  Confrontation, Discussion, Education, Problem-solving, Socialization and Support  Summary of Progress/Problems: MHA Speaker came to talk about his personal journey with substance abuse and addiction. The pt processed ways by which to relate to the speaker. MHA speaker provided handouts and educational information pertaining to groups and services offered by the [facility].   Cylie Dor N Smart LCSW 04/14/2016, 3:03 PM

## 2016-04-14 NOTE — [facility] Suicide Risk Assessment (Signed)
[facility] INPATIENT:  Family/Significant Other Suicide Prevention Education  Suicide Prevention Education:  Education Completed; Felipa EthJamie Samonte (pt's husband) [phone] has been identified by the patient as the family member/significant other with whom the patient will be residing, and identified as the person(s) who will aid the patient in the event of a mental health crisis (suicidal ideations/suicide attempt).  With written consent from the patient, the family member/significant other has been provided the following suicide prevention education, prior to the and/or following the discharge of the patient.  The suicide prevention education provided includes the following:  Suicide risk factors  Suicide prevention and interventions  National Suicide Hotline telephone number  [facility] assessment telephone number  [facility] Emergency Assistance 911  [facility] and/or Residential Mobile Crisis Unit telephone number  Request made of family/significant other to:  Remove weapons (e.g., guns, rifles, knives), all items previously/currently identified as safety concern.    Remove drugs/medications (over-the-counter, prescriptions, illicit drugs), all items previously/currently identified as a safety concern.  The family member/significant other verbalizes understanding of the suicide prevention education information provided.  The family member/significant other agrees to remove the items of safety concern listed above.  Khoa Opdahl N Smart LCSW 04/14/2016, 12:41 PM

## 2016-04-14 NOTE — Tx Team (Signed)
Initial Treatment Plan 04/14/2016 7:20 PM Kathryn Marshall RUE:[id number]    PATIENT STRESSORS: Substance abuse   PATIENT STRENGTHS: Ability for insight Active sense of humor Capable of independent living Communication skills Physical Health   PATIENT IDENTIFIED PROBLEMS: "Get help with drinking"  "Manage sleep better"  "Get into a 12 step program"  Anxiety  Substance Abuse    Depression                 DISCHARGE CRITERIA:  Ability to meet basic life and health needs Verbal commitment to aftercare and medication compliance Withdrawal symptoms are absent or subacute and managed without 24-hour nursing intervention  PRELIMINARY DISCHARGE PLAN: Attend 12-step recovery group Return to previous living arrangement  PATIENT/FAMILY INVOLVEMENT: This treatment plan has been presented to and reviewed with the patient, Kathryn Marshall, and/or family member.  The patient and family have been given the opportunity to ask questions and make suggestions.  Eveline KetoAdediran T Nura Cahoon, RN 04/14/2016, 7:20 PM

## 2016-04-14 NOTE — Tx Team (Signed)
Interdisciplinary Treatment and Diagnostic Plan Update  04/14/2016 Time of Session: 9:30AM Kathryn Marshall MRN: [id number]  Principal Diagnosis: Drug overdose  Secondary Diagnoses: Principal Problem:   Drug overdose Active Problems:   MDD (major depressive disorder)   Current Medications:  Current Facility-Administered Medications  Medication Dose Route Frequency Provider Last Rate Last Dose  . acetaminophen (TYLENOL) tablet 650 mg  650 mg Oral Q6H PRN Shuvon B Rankin, NP      . alum & mag hydroxide-simeth (MAALOX/MYLANTA) 200-200-20 MG/5ML suspension 30 mL  30 mL Oral Q4H PRN Shuvon B Rankin, NP      . FLUoxetine (PROZAC) capsule 40 mg  40 mg Oral Daily Shuvon B Rankin, NP   40 mg at 04/14/16 0746  . hydrOXYzine (ATARAX/VISTARIL) tablet 25 mg  25 mg Oral Q6H PRN Shuvon B Rankin, NP      . levothyroxine (SYNTHROID, LEVOTHROID) tablet 125 mcg  125 mcg Oral QAC breakfast Ethelene Hal, NP   125 mcg at 04/14/16 4098  . loperamide (IMODIUM) capsule 2-4 mg  2-4 mg Oral PRN Shuvon B Rankin, NP      . LORazepam (ATIVAN) tablet 1 mg  1 mg Oral Q6H PRN Shuvon B Rankin, NP      . LORazepam (ATIVAN) tablet 1 mg  1 mg Oral BID Shuvon B Rankin, NP       Followed by  . [START ON 04/15/2016] LORazepam (ATIVAN) tablet 1 mg  1 mg Oral Daily Shuvon B Rankin, NP      . magnesium hydroxide (MILK OF MAGNESIA) suspension 30 mL  30 mL Oral Daily PRN Shuvon B Rankin, NP      . multivitamin with minerals tablet 1 tablet  1 tablet Oral Daily Shuvon B Rankin, NP   1 tablet at 04/14/16 0747  . ondansetron (ZOFRAN-ODT) disintegrating tablet 4 mg  4 mg Oral Q6H PRN Shuvon B Rankin, NP      . thiamine (VITAMIN B-1) tablet 100 mg  100 mg Oral Daily Shuvon B Rankin, NP   100 mg at 04/14/16 0747  . traZODone (DESYREL) tablet 100 mg  100 mg Oral QHS PRN Shuvon B Rankin, NP       PTA Medications: Prescriptions Prior to Admission  Medication Sig Dispense Refill Last Dose  . acetaminophen (TYLENOL) 500 MG tablet  Take 500-1,000 mg by mouth every 6 (six) hours as needed for moderate pain.   unknown  . ALPRAZolam (XANAX) 0.5 MG tablet Take 0.5 mg by mouth 3 (three) times daily as needed for anxiety or sleep.  0 04/10/2016 at Unknown time  . FLUoxetine (PROZAC) 20 MG capsule Take 40 mg by mouth every other day.    Past Week at Unknown time  . levothyroxine (SYNTHROID, LEVOTHROID) 125 MCG tablet Take 125 mcg by mouth daily.   04/10/2016 at Unknown time    Patient Stressors:    Patient Strengths:    Treatment Modalities: Medication Management, Group therapy, Case management,  1 to 1 session with clinician, Psychoeducation, Recreational therapy.   Physician Treatment Plan for Primary Diagnosis: Drug overdose Long Term Goal(s): Improvement in symptoms so as ready for discharge Improvement in symptoms so as ready for discharge   Short Term Goals: Ability to identify changes in lifestyle to reduce recurrence of condition will improve Ability to disclose and discuss suicidal ideas Ability to demonstrate self-control will improve Compliance with prescribed medications will improve Ability to disclose and discuss suicidal ideas Ability to identify and develop effective coping behaviors will improve  Ability to maintain clinical measurements within normal limits will improve Ability to identify triggers associated with substance abuse/mental health issues will improve  Medication Management: Evaluate patient's response, side effects, and tolerance of medication regimen.  Therapeutic Interventions: 1 to 1 sessions, Unit Group sessions and Medication administration.  Evaluation of Outcomes: Met  Physician Treatment Plan for Secondary Diagnosis: Principal Problem:   Drug overdose Active Problems:   MDD (major depressive disorder)  Long Term Goal(s): Improvement in symptoms so as ready for discharge Improvement in symptoms so as ready for discharge   Short Term Goals: Ability to identify changes in  lifestyle to reduce recurrence of condition will improve Ability to disclose and discuss suicidal ideas Ability to demonstrate self-control will improve Compliance with prescribed medications will improve Ability to disclose and discuss suicidal ideas Ability to identify and develop effective coping behaviors will improve Ability to maintain clinical measurements within normal limits will improve Ability to identify triggers associated with substance abuse/mental health issues will improve     Medication Management: Evaluate patient's response, side effects, and tolerance of medication regimen.  Therapeutic Interventions: 1 to 1 sessions, Unit Group sessions and Medication administration.  Evaluation of Outcomes: Met   RN Treatment Plan for Primary Diagnosis: Drug overdose Long Term Goal(s): Knowledge of disease and therapeutic regimen to maintain health will improve  Short Term Goals: Ability to remain free from injury will improve, Ability to disclose and discuss suicidal ideas and Ability to identify and develop effective coping behaviors will improve  Medication Management: RN will administer medications as ordered by provider, will assess and evaluate patient's response and provide education to patient for prescribed medication. RN will report any adverse and/or side effects to prescribing provider.  Therapeutic Interventions: 1 on 1 counseling sessions, Psychoeducation, Medication administration, Evaluate responses to treatment, Monitor vital signs and CBGs as ordered, Perform/monitor CIWA, COWS, AIMS and Fall Risk screenings as ordered, Perform wound care treatments as ordered.  Evaluation of Outcomes: Met   LCSW Treatment Plan for Primary Diagnosis: Drug overdose Long Term Goal(s): Safe transition to appropriate next level of care at discharge, Engage patient in therapeutic group addressing interpersonal concerns.  Short Term Goals: Engage patient in aftercare planning with  referrals and resources, Facilitate patient progression through stages of change regarding substance use diagnoses and concerns and Identify triggers associated with mental health/substance abuse issues  Therapeutic Interventions: Assess for all discharge needs, 1 to 1 time with Social worker, Explore available resources and support systems, Assess for adequacy in community support network, Educate family and significant other(s) on suicide prevention, Complete Psychosocial Assessment, Interpersonal group therapy.  Evaluation of Outcomes: Met   Progress in Treatment: Attending groups: Yes Participating in groups: Yes Taking medication as prescribed: Yes. Toleration medication: Yes. Family/Significant other contact made: SPE completed with pt's husband.  Patient understands diagnosis: Yes. Discussing patient identified problems/goals with staff: Yes. Medical problems stabilized or resolved: Yes. Denies suicidal/homicidal ideation: Yes. Issues/concerns per patient self-inventory: No. Other: n/a  New problem(s) identified: No, Describe:  n/a  New Short Term/Long Term Goal(s): detox; medication stabilization; development of comprehensive mental wellness/sobriety plan.   Discharge Plan or Barriers: Pt plans to return home; follow-up with current PCP-appt had been scheduled for hospital follow-up.  Reason for Continuation of Hospitalization: medication management   Estimated Length of Stay: 1 day (pt scheduled for discharge after lunch on Wed 04/15/16). Pt's daughter will pick her up.   Attendees: Patient: 04/14/2016 3:05 PM  Physician: Dr. Parke Poisson MD 04/14/2016 3:05 PM  Nursing: Demetrius Charity RN 04/14/2016 3:05 PM  RN Care Manager: Lars Pinks CM 04/14/2016 3:05 PM  Social Worker: Maxie Better, LCSW; Verita Schneiders LCSW 04/14/2016 3:05 PM  Recreational Therapist: Rhunette Croft 04/14/2016 3:05 PM  Other: Samuel Jester NP; Lindell Spar NP 04/14/2016 3:05 PM  Other:  04/14/2016 3:05 PM  Other:  04/14/2016 3:05 PM    Scribe for Treatment Team: [place], LCSW 04/14/2016 3:05 PM

## 2016-04-15 MED ORDER — TRAZODONE HCL 100 MG PO TABS: 100.0000 mg | ORAL_TABLET | Freq: Every evening | ORAL | 0 refills | Status: AC | PRN

## 2016-04-15 MED ORDER — LEVOTHYROXINE SODIUM 125 MCG PO TABS: 125.0000 ug | ORAL_TABLET | Freq: Every day | ORAL | 0 refills | Status: AC

## 2016-04-15 MED ORDER — FLUOXETINE HCL 40 MG PO CAPS: 40.0000 mg | ORAL_CAPSULE | Freq: Every day | ORAL | 0 refills | Status: AC

## 2016-04-15 MED ORDER — HYDROXYZINE HCL 25 MG PO TABS: 25.0000 mg | ORAL_TABLET | Freq: Four times a day (QID) | ORAL | 0 refills | Status: AC | PRN

## 2016-04-15 NOTE — Progress Notes (Signed)
D: Pt at the time of assessment continue to deny any form of depression, anxiety, pain, SI, HI or AVH; states, "my husband and kids were here today; I feel great-can't wait to go home." Pt observed reading and interaction with peers in the dayroom. A: PRN medications offered as prescribed.  Support, encouragement, and safe environment provided. 15-minute safety checks continue. R: Pt refused meds; states, "I don't need anything for sleep."  Pt attended AA group meeting. Safety checks continue.

## 2016-04-15 NOTE — Discharge Summary (Signed)
Physician Discharge Summary Note  Patient:  Kathryn Marshall is an 46 y.o., female MRN:  [id number] DOB:  1970/07/13 Patient phone:  [phone] (home)  Patient address:   [address] [place] [address],  Total Time spent with patient: 30 minutes  Date of Admission:  04/12/2016 Date of Discharge: 04/15/2016  Reason for Admission:  overdose  Principal Problem: Drug overdose Discharge Diagnoses: Patient Active Problem List   Diagnosis Date Noted  . Drug overdose [T50.901A]   . MDD (major depressive disorder) [F32.9] 04/12/2016  . Generalized anxiety disorder [F41.1] 04/11/2016  . Alcohol use disorder, severe, dependence (HCC) [F10.20] 04/11/2016    Past Psychiatric History:  See HPI  Past Medical History:  Past Medical History:  Diagnosis Date  . Anxiety   . Hypothyroid   . Hypothyroidism   . Insomnia    History reviewed. No pertinent surgical history. Family History: History reviewed. No pertinent family history. Family Psychiatric  History: see HPI Social History:  History  Alcohol Use  . Yes     History  Drug Use    Social History   Social History  . Marital status: Married    Spouse name: N/A  . Number of children: N/A  . Years of education: N/A   Social History Main Topics  . Smoking status: Never Smoker  . Smokeless tobacco: Never Used  . Alcohol use Yes  . Drug use: Yes  . Sexual activity: Not Asked   Other Topics Concern  . None   Social History Narrative  . None    Hospital Course:    Kathryn Marshall was admitted for Drug overdose and crisis management.  Patient was treated with medications with their indications listed below in detail under Medication List.  Medical problems were identified and treated as needed.  Home medications were restarted as appropriate.  Improvement was monitored by observation and Kathryn Marshall daily report of symptom reduction.  Emotional and mental status was monitored by daily self inventory  reports completed by Kathryn Marshall and clinical staff.  Patient reported continued improvement, denied any new concerns.  Patient had been compliant on medications and denied side effects.  Support and encouragement was provided.    Kathryn Marshall was evaluated by the treatment team for stability and plans for continued recovery upon discharge.  Patient was offered further treatment options upon discharge including Residential, Intensive Outpatient and Outpatient treatment. Patient will follow up with agency listed below for medication management and counseling.  Encouraged patient to maintain satisfactory support network and home environment.  Advised to adhere to medication compliance and outpatient treatment follow up.  Prescriptions provided.       Kathryn Marshall motivation was an integral factor for scheduling further treatment.  Employment, transportation, bed availability, health status, family support, and any pending legal issues were also considered during patient's hospital stay.  Upon completion of this admission the patient was both mentally and medically stable for discharge denying suicidal/homicidal ideation, auditory/visual/tactile hallucinations, delusional thoughts and paranoia.      Physical Findings: AIMS: Facial and Oral Movements Muscles of Facial Expression: None, normal Lips and Perioral Area: None, normal Jaw: None, normal Tongue: None, normal,Extremity Movements Upper (arms, wrists, hands, fingers): None, normal Lower (legs, knees, ankles, toes): None, normal, Trunk Movements Neck, shoulders, hips: None, normal, Overall Severity Severity of abnormal movements (highest score from questions above): None, normal Incapacitation due to abnormal movements: None, normal Patient's awareness of abnormal movements (rate only patient's report): No Awareness, Dental Status Current  problems with teeth and/or dentures?: No Does patient usually wear dentures?: No  CIWA:  CIWA-Ar  Total: 4 COWS:  COWS Total Score: 1  Musculoskeletal: Strength & Muscle Tone: within normal limits Gait & Station: normal Patient leans: N/A  Psychiatric Specialty Exam:  See MD SRA Physical Exam  Nursing note and vitals reviewed.   ROS  Blood pressure 100/66, pulse 85, temperature 97.7 F (36.5 C), temperature source Oral, resp. rate 16, height 5\' 6"  (1.676 m), weight 74.8 kg (165 lb).Body mass index is 26.63 kg/m.   Have you used any form of tobacco in the last 30 days? (Cigarettes, Smokeless Tobacco, Cigars, and/or Pipes): Yes  Has this patient used any form of tobacco in the last 30 days? (Cigarettes, Smokeless Tobacco, Cigars, and/or Pipes) Yes, N/A  Blood Alcohol level:  Lab Results  Component Value Date   ETH 216 (H) 04/11/2016    Metabolic Disorder Labs:  No results found for: HGBA1C, MPG No results found for: PROLACTIN No results found for: CHOL, TRIG, HDL, CHOLHDL, VLDL, LDLCALC  See Psychiatric Specialty Exam and Suicide Risk Assessment completed by Attending Physician prior to discharge.  Discharge destination:  Home  Is patient on multiple antipsychotic therapies at discharge:  No   Has Patient had three or more failed trials of antipsychotic monotherapy by history:  No  Recommended Plan for Multiple Antipsychotic Therapies: NA   Allergies as of 04/15/2016      Reactions   Bee Venom Shortness Of Breath      Medication List    STOP taking these medications   acetaminophen 500 MG tablet Commonly known as:  TYLENOL   ALPRAZolam 0.5 MG tablet Commonly known as:  XANAX     TAKE these medications     Indication  FLUoxetine 40 MG capsule Commonly known as:  PROZAC Take 1 capsule (40 mg total) by mouth daily. Start taking on:  04/16/2016 What changed:  medication strength  when to take this  Indication:  Excessive Use of Alcohol, Depression, anxiety disorder   hydrOXYzine 25 MG tablet Commonly known as:  ATARAX/VISTARIL Take 1 tablet (25 mg  total) by mouth every 6 (six) hours as needed (anxiety/agitation or CIWA < or = 10).  Indication:  Anxiety Neurosis   levothyroxine 125 MCG tablet Commonly known as:  SYNTHROID, LEVOTHROID Take 1 tablet (125 mcg total) by mouth daily before breakfast. Start taking on:  04/16/2016 What changed:  when to take this  Indication:  Underactive Thyroid   traZODone 100 MG tablet Commonly known as:  DESYREL Take 1 tablet (100 mg total) by mouth at bedtime as needed for sleep.  Indication:  Trouble Sleeping      Follow-up Information    [facility]-Med[facility] Follow up on 04/23/2016.   Why:  Appt on this date for hospital follow-up/medication management at 11:45AM with Dr. Cyndia BentBadger. Thank you.  Contact information: ATTN: Dr. Cyndia BentBadger [address] [place], [place] [address] Phone: [phone] Fax: [phone]          Follow-up recommendations:  Activity:  as tol Diet:  as tol  Comments:  1.  Take all your medications as prescribed.   2.  Report any adverse side effects to outpatient provider. 3.  Patient instructed to not use alcohol or illegal drugs while on prescription medicines. 4.  In the event of worsening symptoms, instructed patient to call 911, the crisis hotline or go to nearest emergency room for evaluation of symptoms.  Signed: Velna HatchetSheila May  Dominga Ferry, NP [facility] 04/15/2016, 11:49 AM   Patient seen, Suicide Assessment Completed.  Disposition Plan Reviewed

## 2016-04-15 NOTE — [facility] Suicide Risk Assessment (Signed)
[facility] Discharge Suicide Risk Assessment   Principal Problem: Drug overdose Discharge Diagnoses:  Patient Active Problem List   Diagnosis Date Noted  . Drug overdose [T50.901A]   . MDD (major depressive disorder) [F32.9] 04/12/2016  . Generalized anxiety disorder [F41.1] 04/11/2016  . Alcohol use disorder, severe, dependence (HCC) [F10.20] 04/11/2016    Total Time spent with patient: 45 minutes  Musculoskeletal: Strength & Muscle Tone: within normal limits Gait & Station: normal Patient leans: N/A  Psychiatric Specialty Exam: ROS denies headache, no chest pain, no shortness of breath, no vomiting, no nausea, no fever   Blood pressure 100/66, pulse 85, temperature 97.7 F (36.5 C), temperature source Oral, resp. rate 16, height 5\' 6"  (1.676 m), weight 74.8 kg (165 lb).Body mass index is 26.63 kg/m.  General Appearance: Well Groomed  Eye Contact::  Good  Speech:  Normal Rate409  Volume:  Normal  Mood:  denies depression, reports mood as " normal", presents euthymic  Affect:  Appropriate and Full Range  Thought Process:  Linear  Orientation:  Full (Time, Place, and Person)  Thought Content:  denies hallucinations, no delusions, not internally preoccupied  Suicidal Thoughts:  No denies any suicidal or self injurious ideations, no violent or homicidal ideations   Homicidal Thoughts:  No  Memory:  recent and remote grossly intact   Judgement:  Other:  improving   Insight:  improving   Psychomotor Activity:  Normal- no tremors, no restlessness   Concentration:  Good  Recall:  Good  Fund of Knowledge:Good  Language: Good  Akathisia:  Negative  Handed:  Right  AIMS (if indicated):     Assets:  Communication Skills Desire for Improvement Resilience  Sleep:  Number of Hours: 6.5  Cognition: WNL  ADL's:  Intact   Mental Status Per Nursing Assessment::   On Admission:     Demographic Factors:   46 year old married female, has two children, works part time Loss  Factors: None acute stressors identified   Historical Factors: No prior psychiatric admissions, no history of suicide attempts . History of Prozac trial x 3-4 months for anxiety.  Risk Reduction Factors:   Responsible for children under 66 years of age, Sense of responsibility to family, Living with another person, especially a relative and Positive social support  Continued Clinical Symptoms:  At this time patient is alert, attentive, well related, pleasant, mood is euthymic, affect is appropriate and full in range, no thought disorder, no suicidal or self injurious ideations, no homicidal ideations, no hallucinations, no delusions, not internally preoccupied, future oriented . No symptoms of residual WDL - vitals stable. Denies medication side effects.  Patient states she plans not to restart benzodiazepines after discharge.  Cognitive Features That Contribute To Risk:  No gross cognitive deficits noted upon discharge. Is alert , attentive, and oriented x 3   Suicide Risk:  Mild:  Suicidal ideation of limited frequency, intensity, duration, and specificity.  There are no identifiable plans, no associated intent, mild dysphoria and related symptoms, good self-control (both objective and subjective assessment), few other risk factors, and identifiable protective factors, including available and accessible social support.  Follow-up Information    [facility] Follow up on 04/23/2016.   Why:  Appt on this date for hospital follow-up/medication management at 11:45AM with Dr. Cyndia Bent. Thank you.  Contact information: ATTN: Dr. Cyndia Bent [address] [place], [place] [address] Phone: [phone] Fax: [phone]          Plan Of Care/Follow-up  recommendations:  Activity:  as tolerated  Diet:  regular Tests:  NA Other:  see below  Patient is leaving unit in good spirits Plans to return home Plans to follow up as above   Nehemiah MassedOBOS,  Sudiksha Victor, MD 04/15/2016, 12:15 PM

## 2016-04-15 NOTE — Progress Notes (Signed)
D- Patient has bright affect this shift.  Patient verbalizes excitement regarding discharge plans for today. Patient verbalizes readiness for discharge: Denies SI/HI/AVH. On patient's self-inventory, patient denies any feelings of depression, feelings of hopelessness, or anxiety.  No complaints. A- Discharge instructions read and discussed with patient.  All belongings returned to patient. R- Patient cooperative with discharge process.  Patient verbalizes understanding of discharge instructions.  Signed for return of belongings. Escorted to the lobby.

## 2016-04-15 NOTE — Progress Notes (Signed)
Recreation Therapy Notes  Date: 04/15/16 Time: 0930 Location: [address] Dayroom  Group Topic: Stress Management  Goal Area(s) Addresses:  Patient will verbalize importance of using healthy stress management.  Patient will identify positive emotions associated with healthy stress management.   Behavioral Response: Engaged  Intervention: Stress Management  Activity :  Body Scan Meditation.  LRT introduced the stress management technique of meditation.  LRT played a meditation from the calm app that allowed patients the opportunity to focus on any tension they may be experiencing in their bodies.  Patients were to follow along with the meditation.  Education:  Stress Management, Discharge Planning.   Education Outcome: Acknowledges edcuation/In group clarification offered/Needs additional education  Clinical Observations/Feedback: Pt attended group.   Caroll RancherMarjette Dhaval Woo, LRT/CTRS         Caroll RancherLindsay, Zakariyah Freimark A 04/15/2016 2:37 PM

## 2016-11-13 ENCOUNTER — Ambulatory Visit (INDEPENDENT_AMBULATORY_CARE_PROVIDER_SITE_OTHER): Payer: BLUE CROSS/BLUE SHIELD | Admitting: Orthopaedic Surgery

## 2016-11-13 ENCOUNTER — Encounter (INDEPENDENT_AMBULATORY_CARE_PROVIDER_SITE_OTHER): Payer: Self-pay | Admitting: Orthopaedic Surgery

## 2016-11-13 ENCOUNTER — Ambulatory Visit (INDEPENDENT_AMBULATORY_CARE_PROVIDER_SITE_OTHER): Payer: BLUE CROSS/BLUE SHIELD

## 2016-11-13 VITALS — BP 117/81 | HR 91 | Ht 66.0 in | Wt 165.5 lb

## 2016-11-13 DIAGNOSIS — M25512 Pain in left shoulder: Secondary | ICD-10-CM

## 2016-11-13 NOTE — Progress Notes (Signed)
Office Visit Note   Patient: Kathryn Marshall           Date of Birth: [birth year]           MRN: [id number] Visit Date: 11/13/2016              Requested by: [place], Betty G, MD [address] [place], [place] [address] PCP: Eartha Inch, MD   Assessment & Plan: Visit Diagnoses:  1. Acute pain of left shoulder             With the biceps tendinopathy, acute the onset.  Plan: Left biceps tendon injection performed with improvement in her symptoms she was able move her shoulder better.  Follow-Up Instructions: Return in about 4 weeks (around 12/11/2016).   Orders:  Orders Placed This Encounter  Procedures  . Large Joint Injection/Arthrocentesis  . XR Shoulder Left   No orders of the defined types were placed in this encounter.     Procedures: Large Joint Inj Date/Time: 11/13/2016 11:17 AM Performed by: Eldred Manges Authorized by: Annell Greening C   Consent Given by:  Patient Indications:  Pain Location:  Shoulder Site:  L subacromial bursa Needle Size:  22 G Needle Length:  1.5 inches Ultrasound Guidance: No   Fluoroscopic Guidance: No   Arthrogram: No   Medications:  1 mL lidocaine 1 %; 40 mg methylPREDNISolone acetate 40 MG/ML; 2 mL bupivacaine 0.25 % Aspiration Attempted: No   Patient tolerance:  Patient tolerated the procedure well with no immediate complications     Clinical Data: No additional findings.   Subjective: Chief Complaint  Patient presents with  . Left Shoulder - Pain    HPI-year-old female started to have pain last night in her left shoulder. She's had previous right shoulder surgery no problems with the left in the past dates her left shoulders become painful difficulty moving a problem putting clothes on washing her hair. She has acute pain anteriorly and states she been doing a lot of gardening work recently. No definite injury at the time.  Review of Systems  Constitutional: Negative for chills and diaphoresis.  HENT:  Negative for ear discharge, ear pain and nosebleeds.   Eyes: Negative for discharge and visual disturbance.  Respiratory: Negative for cough, choking and shortness of breath.   Cardiovascular: Negative for chest pain and palpitations.  Gastrointestinal: Negative for abdominal distention and abdominal pain.  Endocrine: Negative for cold intolerance and heat intolerance.  Genitourinary: Negative for flank pain and hematuria.  Skin: Negative for rash and wound.  Neurological: Negative for seizures and speech difficulty.  Hematological: Negative for adenopathy. Does not bruise/bleed easily.  Psychiatric/Behavioral: Negative for agitation and suicidal ideas.   positive for generalized anxiety disorder, alcohol use disorder severe with dependence. History of drug overdose.   Objective: Vital Signs: BP 117/81   Pulse 91   Ht 5\' 6"  (1.676 m)   Wt 165 lb 8 oz (75.1 kg)   BMI 26.71 kg/m   Physical Exam  Constitutional: She is oriented to person, place, and time. She appears well-developed.  HENT:  Head: Normocephalic.  Right Ear: External ear normal.  Left Ear: External ear normal.  Eyes: Pupils are equal, round, and reactive to light.  Neck: No tracheal deviation present. No thyromegaly present.  Cardiovascular: Normal rate.   Pulmonary/Chest: Effort normal.  Abdominal: Soft.  Musculoskeletal:  Patient has acute pain left shoulder anteriorly over the biceps tendon. Elbow reaches full extension good cervical range of motion no  brachial plexus tenderness. Upper 70 reflexes are 2+ and symmetrical. No rash over exposed skin. Negative drop arm test. Passively she has the 50% flexion and abduction with sharp pain anteriorly in point strength of the biceps tendon. Sensation the hand is intact. Negative Spurling normal gait no lower extremity hyperreflexia no lower extremity weakness.  Neurological: She is alert and oriented to person, place, and time.  Skin: Skin is warm and dry.  Psychiatric:  She has a normal mood and affect. Her behavior is normal.    Ortho Exam  Specialty Comments:  No specialty comments available.  Imaging: No results found.   PMFS History: Patient Active Problem List   Diagnosis Date Noted  . Drug overdose   . MDD (major depressive disorder) 04/12/2016  . Generalized anxiety disorder 04/11/2016  . Alcohol use disorder, severe, dependence (HCC) 04/11/2016   Past Medical History:  Diagnosis Date  . Anxiety   . Hypothyroid   . Hypothyroidism   . Insomnia     No family history on file.  No past surgical history on file. Social History   Occupational History  . Not on file.   Social History Main Topics  . Smoking status: Never Smoker  . Smokeless tobacco: Never Used  . Alcohol use Yes  . Drug use: Yes  . Sexual activity: Not on file

## 2016-11-15 MED ORDER — LIDOCAINE HCL 1 % IJ SOLN
1.0000 mL | INTRAMUSCULAR | Status: AC | PRN
Start: 2016-11-13 — End: 2016-11-13
  Administered 2016-11-13: 1 mL

## 2016-11-15 MED ORDER — BUPIVACAINE HCL 0.25 % IJ SOLN
2.0000 mL | INTRAMUSCULAR | Status: AC | PRN
  Administered 2016-11-13: 2 mL via INTRA_ARTICULAR

## 2016-11-15 MED ORDER — METHYLPREDNISOLONE ACETATE 40 MG/ML IJ SUSP
40.0000 mg | INTRAMUSCULAR | Status: AC | PRN
  Administered 2016-11-13: 40 mg via INTRA_ARTICULAR

## 2021-05-20 ENCOUNTER — Other Ambulatory Visit: Payer: Self-pay | Admitting: Family Medicine

## 2021-05-20 DIAGNOSIS — Z1231 Encounter for screening mammogram for malignant neoplasm of breast: Secondary | ICD-10-CM

## 2021-06-12 ENCOUNTER — Ambulatory Visit: Payer: BLUE CROSS/BLUE SHIELD

## 2021-06-12 ENCOUNTER — Inpatient Hospital Stay: Admission: RE | Admit: 2021-06-12 | Payer: BLUE CROSS/BLUE SHIELD | Source: Ambulatory Visit

## 2021-07-17 ENCOUNTER — Ambulatory Visit (INDEPENDENT_AMBULATORY_CARE_PROVIDER_SITE_OTHER): Payer: 59

## 2021-07-17 DIAGNOSIS — Z1231 Encounter for screening mammogram for malignant neoplasm of breast: Secondary | ICD-10-CM

## 2022-04-13 ENCOUNTER — Other Ambulatory Visit: Payer: Self-pay | Admitting: Orthopedic Surgery

## 2022-04-13 DIAGNOSIS — M542 Cervicalgia: Secondary | ICD-10-CM

## 2022-08-28 ENCOUNTER — Other Ambulatory Visit: Payer: Self-pay | Admitting: Family Medicine

## 2022-08-28 DIAGNOSIS — Z1231 Encounter for screening mammogram for malignant neoplasm of breast: Secondary | ICD-10-CM

## 2022-09-16 ENCOUNTER — Ambulatory Visit: Payer: Self-pay

## 2022-09-17 ENCOUNTER — Other Ambulatory Visit: Payer: Self-pay | Admitting: Family Medicine

## 2022-09-17 ENCOUNTER — Ambulatory Visit (INDEPENDENT_AMBULATORY_CARE_PROVIDER_SITE_OTHER): Payer: Commercial Managed Care - HMO

## 2022-09-17 DIAGNOSIS — Z1231 Encounter for screening mammogram for malignant neoplasm of breast: Secondary | ICD-10-CM

## 2022-09-18 LAB — COLOGUARD: COLOGUARD: NEGATIVE

## 2023-07-29 ENCOUNTER — Other Ambulatory Visit: Payer: Self-pay | Admitting: Physician Assistant

## 2023-07-29 DIAGNOSIS — Z1231 Encounter for screening mammogram for malignant neoplasm of breast: Secondary | ICD-10-CM

## 2023-09-22 ENCOUNTER — Ambulatory Visit

## 2023-10-21 ENCOUNTER — Ambulatory Visit

## 2023-10-21 DIAGNOSIS — Z1231 Encounter for screening mammogram for malignant neoplasm of breast: Secondary | ICD-10-CM

## 2023-12-02 IMAGING — MG DIGITAL SCREENING BREAST BILAT IMPLANT W/ TOMO W/ CAD
8 of 12 series · 8 of 28 positions shown · non-contrast
Comparison: Previous exam(s).

CLINICAL DATA: Screening.

EXAM:
DIGITAL SCREENING BILATERAL MAMMOGRAM WITH IMPLANTS, CAD AND
TOMOSYNTHESIS
TECHNIQUE: Bilateral screening digital craniocaudal and mediolateral oblique
mammograms were obtained. Bilateral screening digital breast
tomosynthesis was performed. The images were evaluated with
computer-aided detection. Standard and/or implant displaced views
were performed.

[L MLO]
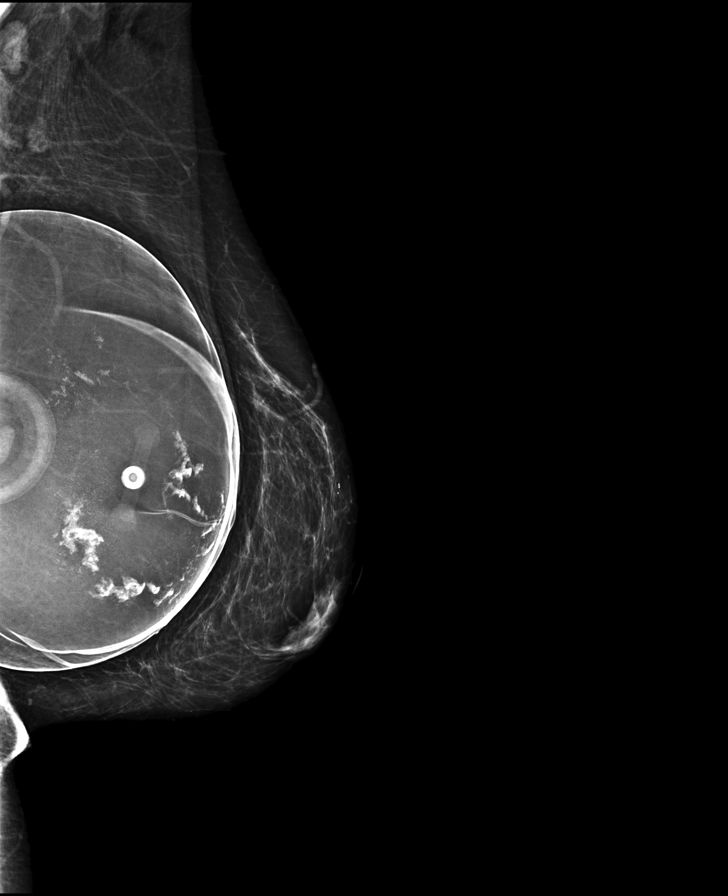

[L CC]
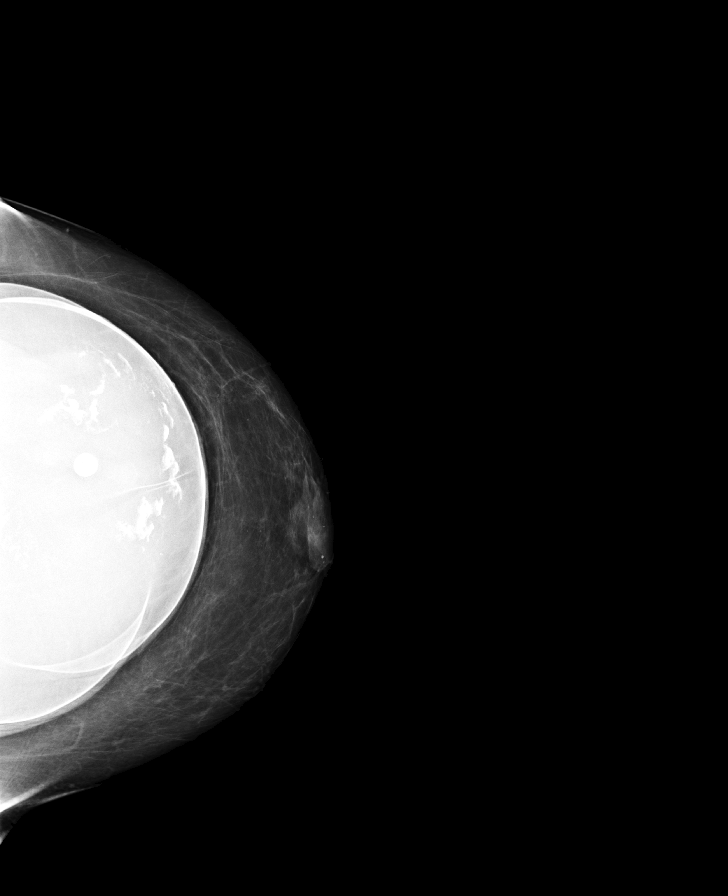

[R MLO]
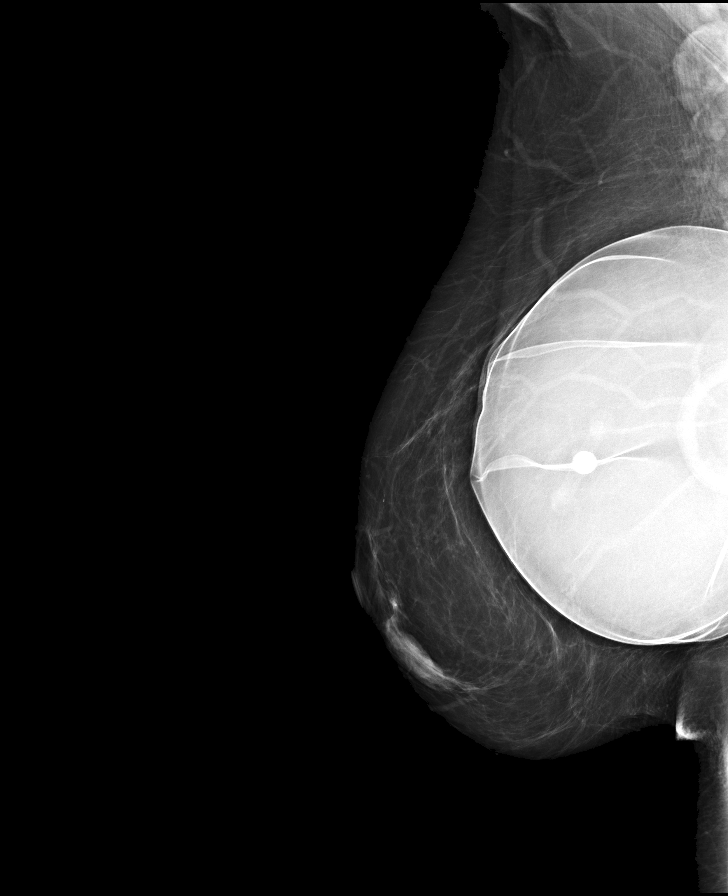

[R CC]
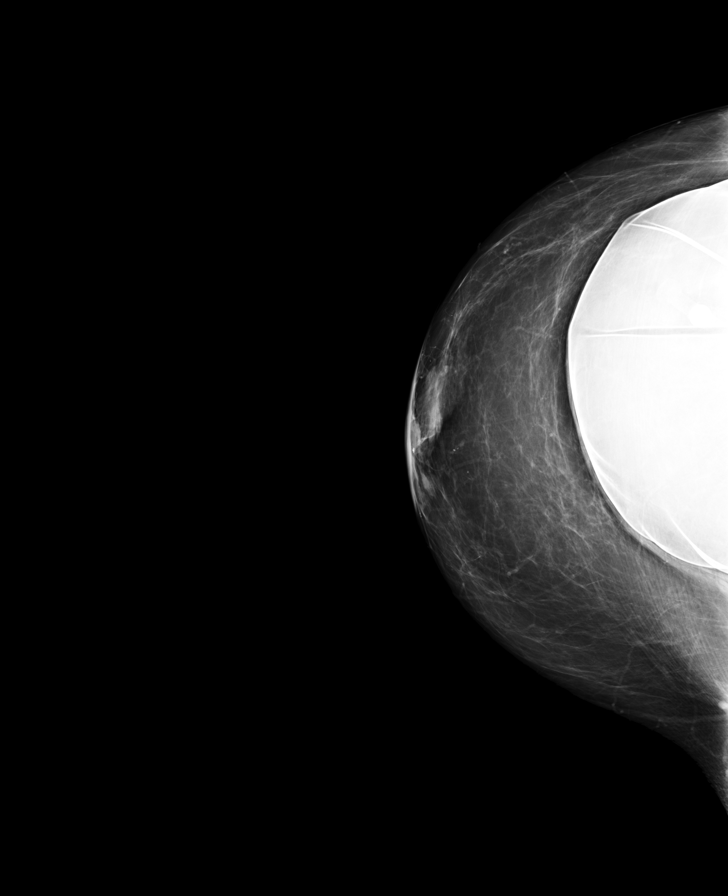

[L CC synth-2D]
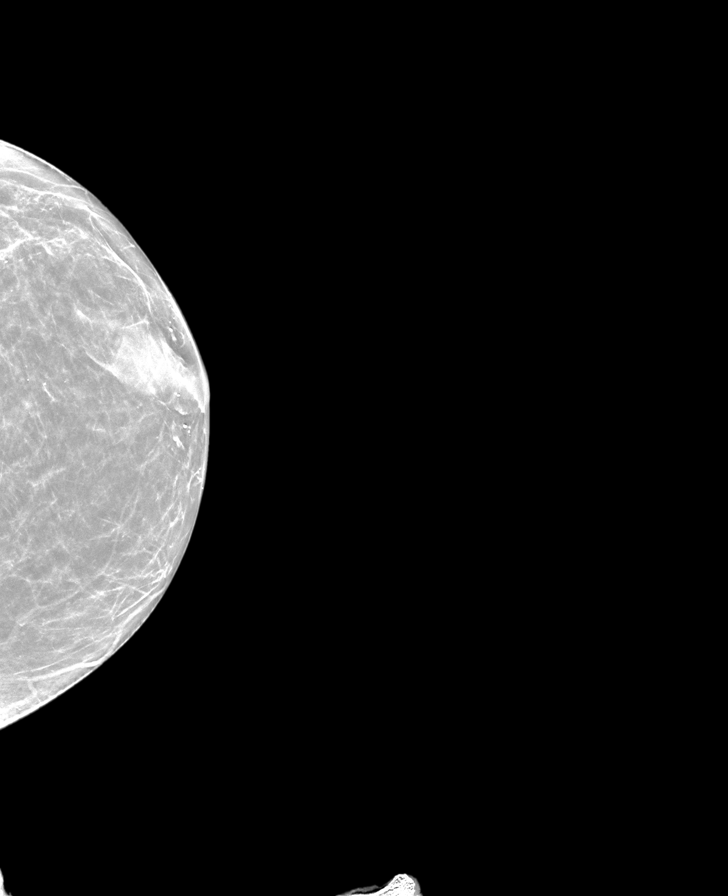

[L MLO synth-2D]
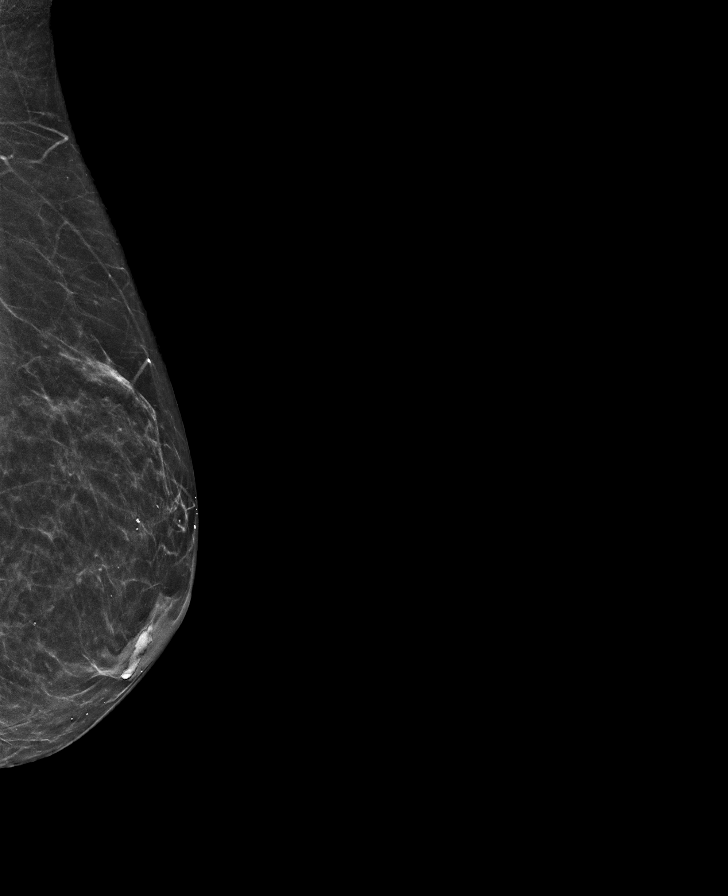

[R MLO synth-2D]
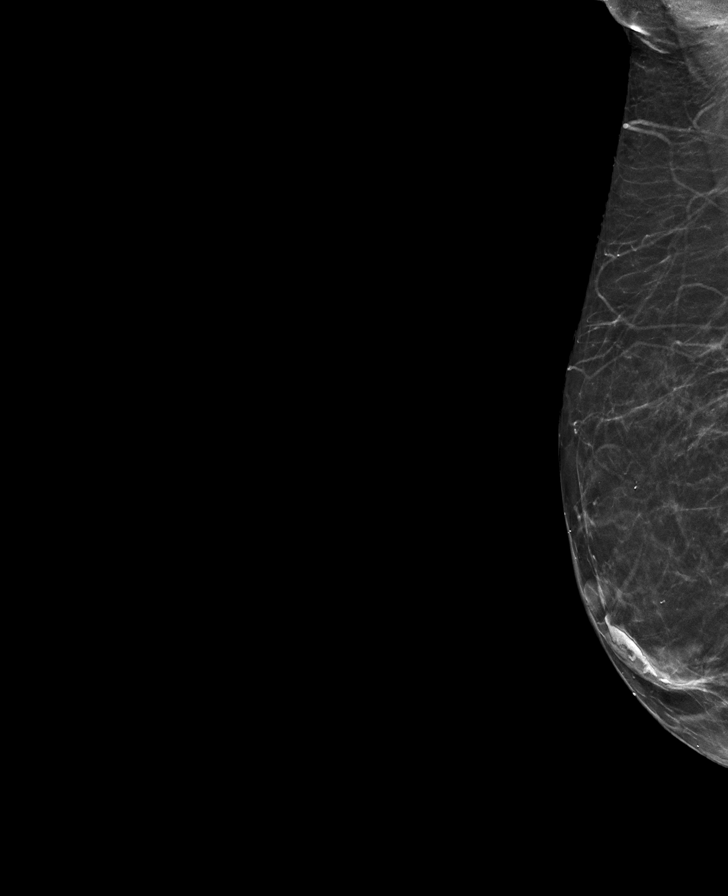

[R CC synth-2D]
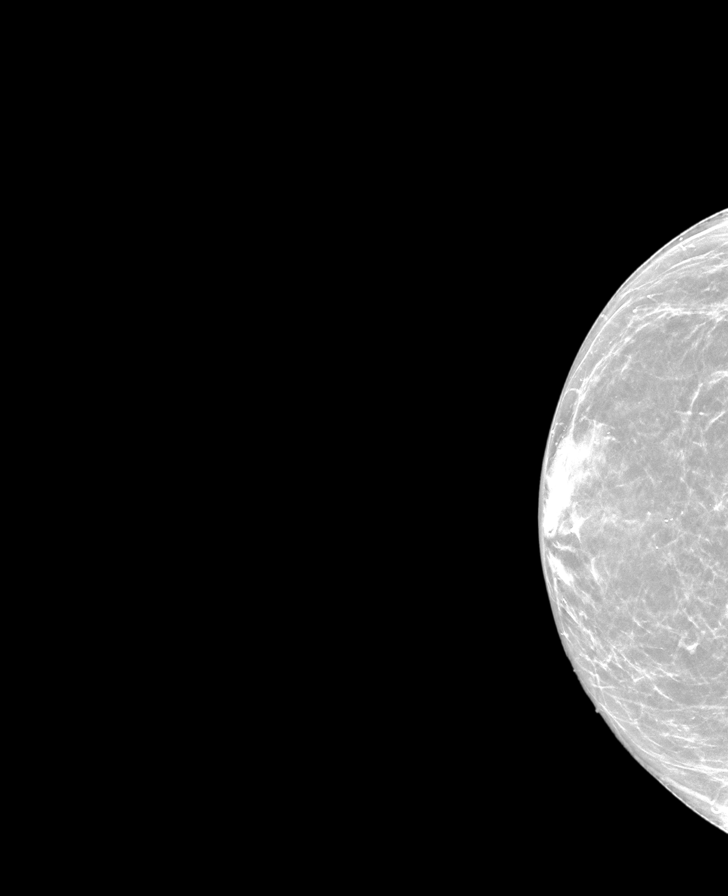

[8 of 28 positions shown; findings below may reference images not displayed]

ACR Breast Density Category b: There are scattered areas of
fibroglandular density.
FINDINGS: The patient has retropectoral implants. There are no findings
suspicious for malignancy.
IMPRESSION: No mammographic evidence of malignancy. A result letter of this
screening mammogram will be mailed directly to the patient.

RECOMMENDATION:
Screening mammogram in one year. (Code:SE-S-JMG)

BI-RADS CATEGORY  1:  Negative.
